# Patient Record
Sex: Female | Born: 1965 | Race: White | Hispanic: No | Marital: Single | Smoking: Current every day smoker
Health system: Southern US, Community
[De-identification: ages and names within clinical notes are randomized; demographics above are authoritative.]

## PROBLEM LIST (undated history)

## (undated) DIAGNOSIS — K219 Gastro-esophageal reflux disease without esophagitis: Secondary | ICD-10-CM

## (undated) DIAGNOSIS — G43909 Migraine, unspecified, not intractable, without status migrainosus: Secondary | ICD-10-CM

## (undated) DIAGNOSIS — T7840XA Allergy, unspecified, initial encounter: Secondary | ICD-10-CM

## (undated) DIAGNOSIS — J40 Bronchitis, not specified as acute or chronic: Secondary | ICD-10-CM

## (undated) HISTORY — PX: TUBAL LIGATION: SHX77

## (undated) HISTORY — PX: CHOLECYSTECTOMY: SHX55

## (undated) HISTORY — PX: ABLATION: SHX5711

## (undated) HISTORY — PX: BLADDER SURGERY: SHX569

---

## 2014-08-12 DIAGNOSIS — IMO0002 Reserved for concepts with insufficient information to code with codable children: Secondary | ICD-10-CM | POA: Insufficient documentation

## 2014-08-12 DIAGNOSIS — J309 Allergic rhinitis, unspecified: Secondary | ICD-10-CM | POA: Insufficient documentation

## 2014-08-12 DIAGNOSIS — R350 Frequency of micturition: Secondary | ICD-10-CM | POA: Insufficient documentation

## 2014-08-12 DIAGNOSIS — R1030 Lower abdominal pain, unspecified: Secondary | ICD-10-CM | POA: Insufficient documentation

## 2014-08-12 DIAGNOSIS — F309 Manic episode, unspecified: Secondary | ICD-10-CM | POA: Insufficient documentation

## 2014-08-12 DIAGNOSIS — F4310 Post-traumatic stress disorder, unspecified: Secondary | ICD-10-CM | POA: Insufficient documentation

## 2014-08-12 DIAGNOSIS — R519 Headache, unspecified: Secondary | ICD-10-CM | POA: Insufficient documentation

## 2014-08-12 DIAGNOSIS — N390 Urinary tract infection, site not specified: Secondary | ICD-10-CM | POA: Insufficient documentation

## 2014-08-12 DIAGNOSIS — R351 Nocturia: Secondary | ICD-10-CM | POA: Insufficient documentation

## 2014-08-12 DIAGNOSIS — B9689 Other specified bacterial agents as the cause of diseases classified elsewhere: Secondary | ICD-10-CM | POA: Insufficient documentation

## 2014-08-12 DIAGNOSIS — N3946 Mixed incontinence: Secondary | ICD-10-CM | POA: Insufficient documentation

## 2019-04-02 DIAGNOSIS — Z8619 Personal history of other infectious and parasitic diseases: Secondary | ICD-10-CM | POA: Insufficient documentation

## 2019-04-02 DIAGNOSIS — N200 Calculus of kidney: Secondary | ICD-10-CM | POA: Insufficient documentation

## 2019-11-15 DIAGNOSIS — G4719 Other hypersomnia: Secondary | ICD-10-CM | POA: Insufficient documentation

## 2019-11-15 DIAGNOSIS — F17210 Nicotine dependence, cigarettes, uncomplicated: Secondary | ICD-10-CM | POA: Insufficient documentation

## 2019-11-15 DIAGNOSIS — F411 Generalized anxiety disorder: Secondary | ICD-10-CM | POA: Insufficient documentation

## 2019-11-15 DIAGNOSIS — G8929 Other chronic pain: Secondary | ICD-10-CM | POA: Insufficient documentation

## 2019-12-04 ENCOUNTER — Emergency Department: Payer: Medicaid Other

## 2019-12-04 ENCOUNTER — Other Ambulatory Visit: Payer: Self-pay

## 2019-12-04 ENCOUNTER — Emergency Department
Admission: EM | Admit: 2019-12-04 | Discharge: 2019-12-04 | Disposition: A | Discharge status: Home / Self Care | Payer: Medicaid Other | Attending: Emergency Medicine | Admitting: Emergency Medicine

## 2019-12-04 DIAGNOSIS — R109 Unspecified abdominal pain: Secondary | ICD-10-CM | POA: Diagnosis present

## 2019-12-04 DIAGNOSIS — J209 Acute bronchitis, unspecified: Secondary | ICD-10-CM | POA: Diagnosis not present

## 2019-12-04 DIAGNOSIS — Z20822 Contact with and (suspected) exposure to covid-19: Secondary | ICD-10-CM | POA: Insufficient documentation

## 2019-12-04 DIAGNOSIS — J208 Acute bronchitis due to other specified organisms: Secondary | ICD-10-CM

## 2019-12-04 DIAGNOSIS — F17200 Nicotine dependence, unspecified, uncomplicated: Secondary | ICD-10-CM | POA: Diagnosis not present

## 2019-12-04 DIAGNOSIS — N200 Calculus of kidney: Secondary | ICD-10-CM | POA: Diagnosis not present

## 2019-12-04 HISTORY — DX: Bronchitis, not specified as acute or chronic: J40

## 2019-12-04 HISTORY — DX: Migraine, unspecified, not intractable, without status migrainosus: G43.909

## 2019-12-04 HISTORY — DX: Allergy, unspecified, initial encounter: T78.40XA

## 2019-12-04 LAB — SARS CORONAVIRUS 2 (TAT 6-24 HRS): SARS Coronavirus 2: NEGATIVE

## 2019-12-04 LAB — URINALYSIS, COMPLETE (UACMP) WITH MICROSCOPIC
Bacteria, UA: NONE SEEN
Bilirubin Urine: NEGATIVE
Glucose, UA: NEGATIVE mg/dL
Hgb urine dipstick: NEGATIVE
Ketones, ur: NEGATIVE mg/dL
Leukocytes,Ua: NEGATIVE
Nitrite: NEGATIVE
Protein, ur: NEGATIVE mg/dL
Specific Gravity, Urine: 1.009 (ref 1.005–1.030)
pH: 6 (ref 5.0–8.0)

## 2019-12-04 MED ORDER — TRAMADOL HCL 50 MG PO TABS: 50.0000 mg | ORAL_TABLET | Freq: Four times a day (QID) | ORAL | 0 refills | Status: DC | PRN

## 2019-12-04 MED ORDER — ALBUTEROL SULFATE HFA 108 (90 BASE) MCG/ACT IN AERS: 2.0000 | INHALATION_SPRAY | Freq: Four times a day (QID) | RESPIRATORY_TRACT | 0 refills | Status: AC | PRN

## 2019-12-04 MED ORDER — PREDNISONE 10 MG (21) PO TBPK: ORAL_TABLET | ORAL | 0 refills | Status: DC

## 2019-12-04 NOTE — ED Provider Notes (Signed)
[facility] Emergency Department Provider Note  ____________________________________________   First MD Initiated Contact with Patient 12/04/19 1759     (approximate)  I have reviewed the triage vital signs and the nursing notes.   HISTORY  Chief Complaint Back Pain    HPI Jacqueline Patel is a 54 y.o. female [name] emergency department complaining of cough and congestion with some chest tightness.  Symptoms for over a week getting worse over the last 4 days.  States the cough is productive.  Complaint #2 is she continues to have back pain and was told she has kidney stones.  She states she is hurting more than usual.  No fever or chills.  No chest pain/shortness of breath at this time.  No vomiting or diarrhea.  Unsure of Covid exposures    Past Medical History:  Diagnosis Date  . Allergies   . Bronchitis   . Migraines     There are no problems to display for this patient.   Past Surgical History:  Procedure Laterality Date  . ABLATION    . BLADDER SURGERY    . CESAREAN SECTION    . CHOLECYSTECTOMY    . TUBAL LIGATION      Prior to Admission medications   Medication Sig Start Date End Date Taking? Authorizing Provider  albuterol (VENTOLIN HFA) 108 (90 Base) MCG/ACT inhaler Inhale 2 puffs into the lungs every 6 (six) hours as needed for wheezing or shortness of breath. 12/04/19   Jacqueline Patel, Jacqueline Bering, PA-C  predniSONE (STERAPRED UNI-PAK 21 TAB) 10 MG (21) TBPK tablet Take 6 pills on day one then decrease by 1 pill each day 12/04/19   Jacqueline Ghee, PA-C  traMADol (ULTRAM) 50 MG tablet Take 1 tablet (50 mg total) by mouth every 6 (six) hours as needed. 12/04/19   Jacqueline Ghee, PA-C    Allergies Aleve  [naproxen], Codeine, Fexofenadine, Naproxen sodium, Tetracyclines & related, Dicyclomine, Sulfamethoxazole, Doxycycline, Fexofenadine-pseudoephed er, Sulfa antibiotics, and Sulfur  History reviewed. No pertinent family history.  Social  History Social History   Tobacco Use  . Smoking status: Current Every Day Smoker  . Smokeless tobacco: Never Used  Substance Use Topics  . Alcohol use: Yes    Comment: social  . Drug use: Yes    Types: Marijuana    Review of Systems  Constitutional: No fever/chills Eyes: No visual changes. ENT: No sore throat. Respiratory: Positive cough Cardiovascular: Denies chest pain Gastrointestinal: Denies abdominal pain, positive for pain at the kidney area Genitourinary: Negative for dysuria. Musculoskeletal: Negative for back pain. Skin: Negative for rash. Psychiatric: no mood changes,     ____________________________________________   PHYSICAL EXAM:  VITAL SIGNS: ED Triage Vitals [12/04/19 1656]  Enc Vitals Group     BP 128/71     Pulse Rate 86     Resp 18     Temp (!) 97.5 F (36.4 C)     Temp Source Oral     SpO2 98 %     Weight 165 lb (74.8 kg)     Height 5' 4.5" (1.638 m)     Head Circumference      Peak Flow      Pain Score 9     Pain Loc      Pain Edu?      Excl. in [place]?     Constitutional: Alert and oriented. Well appearing and in no acute distress. Eyes: Conjunctivae are normal.  Head: Atraumatic. Nose: No congestion/rhinnorhea. Mouth/Throat: Mucous  membranes are moist.   Neck:  supple no lymphadenopathy noted Cardiovascular: Normal rate, regular rhythm. Heart sounds are normal Respiratory: Normal respiratory effort.  No retractions, lungs c t a  Abd: soft nontender bs normal all 4 quad, no CVA tenderness GU: deferred Musculoskeletal: FROM all extremities, warm and well perfused Neurologic:  Normal speech and language.  Skin:  Skin is warm, dry and intact. No rash noted. Psychiatric: Mood and affect are normal. Speech and behavior are normal.  ____________________________________________   LABS (all labs ordered are listed, but only abnormal results are displayed)  Labs Reviewed  URINALYSIS, COMPLETE (UACMP) WITH MICROSCOPIC - Abnormal;  Notable for the following components:      Result Value   Color, Urine YELLOW (*)    APPearance CLEAR (*)    All other components within normal limits  SARS CORONAVIRUS 2 (TAT 6-24 HRS)   ____________________________________________   ____________________________________________  RADIOLOGY  Chest x-ray shows interstitial bronchitis of viral nature CT renal stone shows bilateral 2 mm kidney stones, no ureteral stones are noted   ____________________________________________   PROCEDURES  Procedure(s) performed: No  Procedures    ____________________________________________   INITIAL IMPRESSION / ASSESSMENT AND PLAN / ED COURSE  Pertinent labs & imaging results that were available during my care of the patient were reviewed by me and considered in my medical decision making (see chart for details).   Patient is 54 year old female presents emergency department for URI symptoms along with back pain from kidney stones.  See HPI  Physical exam shows patient to appear well.  Exam is unremarkable other than a dry cough.  DDX: covid, bronchitis, chronic back pain, kidney stone  UA is normal CT renal stone study is positive for 2 bilateral kidney stones, no ureteral stones are noted Chest x-ray shows a viral bronchitis   Explained all the findings to the patient.  She was given a prescription for Sterapred and albuterol inhaler.  Strict instructions to return if more difficulty breathing.  Explained to her Covid test would result in 6 to 24 hours.  If positive she should quarantine for an additional 10 days. CT results were explained to her.  Explained her these are stable stones ever within the kidneys.  She may or may not ever passed these.  She can drink lemon juice to help disintegrate stones.  Take over-the-counter pain medication.  She was also given a prescription for tramadol for pain not controlled by over-the-counter pain medication.  She is to follow-up with her regular  doctor.  Return emergency department as needed.  She discharged stable condition.  Jacqueline Patel was evaluated in Emergency Department on 12/04/2019 for the symptoms described in the history of present illness. She was evaluated in the context of the global COVID-19 pandemic, which necessitated consideration that the patient might be at risk for infection with the SARS-CoV-2 virus that causes COVID-19. Institutional protocols and algorithms that pertain to the evaluation of patients at risk for COVID-19 are in a state of rapid change based on information released by regulatory bodies including the CDC and federal and state organizations. These policies and algorithms were followed during the patient's care in the ED.   As part of my medical decision making, I reviewed the following data within the electronic MEDICAL RECORD NUMBER Nursing notes reviewed and incorporated, Labs reviewed , Old chart reviewed, Radiograph reviewed , Notes from prior ED visits and [place] Controlled Substance Database  ____________________________________________   FINAL CLINICAL IMPRESSION(S) / ED DIAGNOSES  Final  diagnoses:  Acute viral bronchitis  Suspected COVID-19 virus infection  Kidney stones      NEW MEDICATIONS STARTED DURING THIS VISIT:  Discharge Medication List as of 12/04/2019  7:33 PM    START taking these medications   Details  albuterol (VENTOLIN HFA) 108 (90 Base) MCG/ACT inhaler Inhale 2 puffs into the lungs every 6 (six) hours as needed for wheezing or shortness of breath., Starting Thu 12/04/2019, Normal    predniSONE (STERAPRED UNI-PAK 21 TAB) 10 MG (21) TBPK tablet Take 6 pills on day one then decrease by 1 pill each day, Normal    traMADol (ULTRAM) 50 MG tablet Take 1 tablet (50 mg total) by mouth every 6 (six) hours as needed., Starting Thu 12/04/2019, Normal         Note:  This document was prepared using Dragon voice recognition software and may include unintentional dictation errors.     Versie Starks, PA-C 12/04/19 1948    Vanessa Brilliant, MD 12/08/19 [phone]

## 2019-12-04 NOTE — ED Notes (Addendum)
CT RENAL STONE STUDY not complete at this time

## 2019-12-04 NOTE — ED Triage Notes (Signed)
Pt to the er for back pain at the left kidney. Pt has been dx with stones but has imaging scheduled soon with pcp. Pt states she has also lost her voice and is cold and has SOB, and coughs and blows her nose which are both productive.

## 2019-12-04 NOTE — Discharge Instructions (Addendum)
Follow-up with your regular doctor for kidney stones.  Return emergency department if worsening and having more difficulty breathing.  Your Covid test should return in 6 to 24 hours.  If you have MyChart you will be able to see the result immediately.  Otherwise someone will call you if it is positive.  Take your medications as prescribed.  The tramadol is for pain not controlled by over-the-counter pain medications.

## 2019-12-04 NOTE — ED Notes (Signed)
Pt reports lower back pain.  No known injury  No n/v/d.   Hx kidney stones.  Pt also has wraspy voice, cough for several weeks.  No fever  cig smoker.  Pt alert

## 2019-12-13 ENCOUNTER — Emergency Department
Admission: EM | Admit: 2019-12-13 | Discharge: 2019-12-14 | Disposition: A | Discharge status: Home / Self Care | Payer: Medicaid Other | Attending: Emergency Medicine | Admitting: Emergency Medicine

## 2019-12-13 ENCOUNTER — Encounter: Payer: Self-pay | Admitting: Emergency Medicine

## 2019-12-13 ENCOUNTER — Emergency Department: Payer: Medicaid Other

## 2019-12-13 ENCOUNTER — Other Ambulatory Visit: Payer: Self-pay

## 2019-12-13 DIAGNOSIS — Z20822 Contact with and (suspected) exposure to covid-19: Secondary | ICD-10-CM | POA: Insufficient documentation

## 2019-12-13 DIAGNOSIS — J40 Bronchitis, not specified as acute or chronic: Secondary | ICD-10-CM

## 2019-12-13 DIAGNOSIS — R0789 Other chest pain: Secondary | ICD-10-CM | POA: Diagnosis present

## 2019-12-13 DIAGNOSIS — F17200 Nicotine dependence, unspecified, uncomplicated: Secondary | ICD-10-CM | POA: Diagnosis not present

## 2019-12-13 LAB — BASIC METABOLIC PANEL
Anion gap: 8 (ref 5–15)
BUN: 10 mg/dL (ref 6–20)
CO2: 21 mmol/L — ABNORMAL LOW (ref 22–32)
Calcium: 8.6 mg/dL — ABNORMAL LOW (ref 8.9–10.3)
Chloride: 107 mmol/L (ref 98–111)
Creatinine, Ser: 0.59 mg/dL (ref 0.44–1.00)
GFR calc Af Amer: >60 mL/min (ref >60)
GFR calc non Af Amer: >60 mL/min (ref >60)
Glucose, Bld: 122 mg/dL — ABNORMAL HIGH (ref 70–99)
Potassium: 3.3 mmol/L — ABNORMAL LOW (ref 3.5–5.1)
Sodium: 136 mmol/L (ref 135–145)

## 2019-12-13 LAB — CBC
HCT: 47.7 % — ABNORMAL HIGH (ref 36.0–46.0)
Hemoglobin: 15.6 g/dL — ABNORMAL HIGH (ref 12.0–15.0)
MCH: 30.2 pg (ref 26.0–34.0)
MCHC: 32.7 g/dL (ref 30.0–36.0)
MCV: 92.4 fL (ref 80.0–100.0)
Platelets: 287 10*3/uL (ref 150–400)
RBC: 5.16 MIL/uL — ABNORMAL HIGH (ref 3.87–5.11)
RDW: 13.6 % (ref 11.5–15.5)
WBC: 10 10*3/uL (ref 4.0–10.5)
nRBC: 0 % (ref 0.0–0.2)

## 2019-12-13 LAB — TROPONIN I (HIGH SENSITIVITY): Troponin I (High Sensitivity): 4 ng/L (ref <18)

## 2019-12-13 MED ORDER — SODIUM CHLORIDE 0.9 % IV BOLUS
1000.0000 mL | Freq: Once | INTRAVENOUS | Status: AC
  Administered 2019-12-14: 1000 mL via INTRAVENOUS

## 2019-12-13 MED ORDER — IPRATROPIUM-ALBUTEROL 0.5-2.5 (3) MG/3ML IN SOLN
3.0000 mL | Freq: Once | RESPIRATORY_TRACT | Status: AC
  Administered 2019-12-14: 3 mL via RESPIRATORY_TRACT
  Filled 2019-12-13: qty 3

## 2019-12-13 MED ORDER — METHYLPREDNISOLONE SODIUM SUCC 125 MG IJ SOLR
125.0000 mg | Freq: Once | INTRAMUSCULAR | Status: AC
  Administered 2019-12-14: 125 mg via INTRAVENOUS
  Filled 2019-12-13: qty 2

## 2019-12-13 MED ORDER — ONDANSETRON HCL 4 MG/2ML IJ SOLN
4.0000 mg | Freq: Once | INTRAMUSCULAR | Status: AC
  Administered 2019-12-14: 4 mg via INTRAVENOUS
  Filled 2019-12-13: qty 2

## 2019-12-13 NOTE — ED Triage Notes (Signed)
Pt arrives POV tro triage with c/o chest pain today. Pt states that she was sneezing an abnormal amount yesterday and is unsure if the pain is due to that fact. Pt is sleepy at this time in triage due to taking x 2 tylenol PM for the pain.

## 2019-12-14 LAB — TROPONIN I (HIGH SENSITIVITY): Troponin I (High Sensitivity): 5 ng/L (ref <18)

## 2019-12-14 LAB — SARS CORONAVIRUS 2 (TAT 6-24 HRS): SARS Coronavirus 2: NEGATIVE

## 2019-12-14 MED ORDER — PREDNISONE 20 MG PO TABS: 60.0000 mg | ORAL_TABLET | Freq: Every day | ORAL | 0 refills | Status: AC

## 2019-12-14 MED ORDER — AZITHROMYCIN 250 MG PO TABS: ORAL_TABLET | ORAL | 0 refills | Status: DC

## 2019-12-14 NOTE — ED Provider Notes (Signed)
[facility] Emergency Department Provider Note  ____________________________________________  Time seen: Approximately 12:23 AM  I have reviewed the triage vital signs and the nursing notes.   HISTORY  Chief Complaint Chest Pain   HPI Jacqueline Patel is a 54 y.o. female with a history of bronchitis, allergies, asthma, smoking who presents for evaluation of chest tightness.   Patient was seen here 10 days ago for similar and diagnosed with bronchitis.  She was sent home on steroids.  She reports that she felt better for a few days however 3 days ago started feeling worse again.  She tested negative for Covid during that visit.  Her symptoms have been cough productive of gray sputum, intermittent wheezing, chest tightness, body aches, and a mild sore throat.  She also has had nausea and feels very fatigued.  She has been having intermittent tightness in her chest.  No pleuritic chest pain, no shortness of breath, no vomiting or diarrhea.  No personal history of blood clots, no recent travel immobilization, no leg pain or swelling, no hemoptysis or exogenous hormones.  Patient continues to smoke.  Past Medical History:  Diagnosis Date  . Allergies   . Bronchitis   . Migraines     There are no problems to display for this patient.   Past Surgical History:  Procedure Laterality Date  . ABLATION    . BLADDER SURGERY    . CESAREAN SECTION    . CHOLECYSTECTOMY    . TUBAL LIGATION      Prior to Admission medications   Medication Sig Start Date End Date Taking? Authorizing Provider  albuterol (VENTOLIN HFA) 108 (90 Base) MCG/ACT inhaler Inhale 2 puffs into the lungs every 6 (six) hours as needed for wheezing or shortness of breath. 12/04/19   Fisher, Roselyn Bering, PA-C  azithromycin (ZITHROMAX) 250 MG tablet Take 2 pills day one and then 1 pill a day for the next 4 days 12/14/19   Don Perking, [place], MD  predniSONE (DELTASONE) 20 MG tablet Take 3 tablets (60 mg total)  by mouth daily for 4 days. 12/14/19 12/18/19  Nita Sickle, MD  traMADol (ULTRAM) 50 MG tablet Take 1 tablet (50 mg total) by mouth every 6 (six) hours as needed. 12/04/19   Faythe Ghee, PA-C    Allergies Aleve  [naproxen], Codeine, Fexofenadine, Naproxen sodium, Tetracyclines & related, Dicyclomine, Sulfamethoxazole, Doxycycline, Fexofenadine-pseudoephed er, Sulfa antibiotics, and Sulfur  No family history on file.  Social History Social History   Tobacco Use  . Smoking status: Current Every Day Smoker  . Smokeless tobacco: Never Used  Substance Use Topics  . Alcohol use: Yes    Comment: social  . Drug use: Yes    Types: Marijuana    Review of Systems  Constitutional: Negative for fever. + fatigue Eyes: Negative for visual changes. ENT: Negative for sore throat. Neck: No neck pain  Cardiovascular: Negative for chest pain. + chest tightness Respiratory: Negative for shortness of breath. + cough Gastrointestinal: Negative for abdominal pain, vomiting or diarrhea. + nausea Genitourinary: Negative for dysuria. Musculoskeletal: Negative for back pain. Skin: Negative for rash. Neurological: Negative for headaches, weakness or numbness. Psych: No SI or HI  ____________________________________________   PHYSICAL EXAM:  VITAL SIGNS: ED Triage Vitals  Enc Vitals Group     BP 12/13/19 2257 111/71     Pulse Rate 12/13/19 2257 82     Resp 12/13/19 2257 18     Temp 12/13/19 2257 97.8 F (36.6 C)  Temp Source 12/13/19 2257 Oral     SpO2 12/13/19 2257 95 %     Weight 12/13/19 2254 165 lb (74.8 kg)     Height 12/13/19 2254 5\' 4"  (1.626 m)     Head Circumference --      Peak Flow --      Pain Score 12/13/19 2254 10     Pain Loc --      Pain Edu? --      Excl. in [place]? --     Constitutional: Alert and oriented. Well appearing and in no apparent distress. HEENT:      Head: Normocephalic and atraumatic.         Eyes: Conjunctivae are normal. Sclera is non-icteric.         Mouth/Throat: Mucous membranes are moist.       Neck: Supple with no signs of meningismus. Cardiovascular: Regular rate and rhythm. No murmurs, gallops, or rubs. 2+ symmetrical distal pulses are present in all extremities. Respiratory: Normal respiratory effort. Lungs are clear to auscultation bilaterally. No wheezes, crackles, or rhonchi.  Gastrointestinal: Soft, non tender, and non distended  Musculoskeletal: Nontender with normal range of motion in all extremities. No edema, cyanosis, or erythema of extremities. Neurologic: Normal speech and language. Face is symmetric. Moving all extremities. No gross focal neurologic deficits are appreciated. Skin: Skin is warm, dry and intact. No rash noted. Psychiatric: Mood and affect are normal. Speech and behavior are normal.  ____________________________________________   LABS (all labs ordered are listed, but only abnormal results are displayed)  Labs Reviewed  BASIC METABOLIC PANEL - Abnormal; Notable for the following components:      Result Value   Potassium 3.3 (*)    CO2 21 (*)    Glucose, Bld 122 (*)    Calcium 8.6 (*)    All other components within normal limits  CBC - Abnormal; Notable for the following components:   RBC 5.16 (*)    Hemoglobin 15.6 (*)    HCT 47.7 (*)    All other components within normal limits  SARS CORONAVIRUS 2 (TAT 6-24 HRS)  TROPONIN I (HIGH SENSITIVITY)  TROPONIN I (HIGH SENSITIVITY)   ____________________________________________  EKG  ED ECG REPORT I, Rudene Re, the attending physician, personally viewed and interpreted this ECG.  Normal sinus rhythm, rate of 77, normal intervals, normal axis, no ST elevations or depressions, anterior Q waves.  No prior for comparison. ____________________________________________  RADIOLOGY  I have personally reviewed the images performed during this visit and I agree with the Radiologist's read.   Interpretation by Radiologist:  DG Chest 2  View  Result Date: 12/13/2019 CLINICAL DATA:  Chest pain. EXAM: CHEST - 2 VIEW COMPARISON:  Radiograph 12/03/2018 FINDINGS: Low lung volumes with persistent but improved streaky bibasilar opacities, likely atelectasis or scarring. No confluent airspace disease. Normal heart size and mediastinal contours. No pulmonary edema, pleural effusion, or pneumothorax. No acute osseous abnormalities are seen. IMPRESSION: Low lung volumes with bibasilar atelectasis or scarring. Electronically Signed   By: Keith Rake M.D.   On: 12/13/2019 23:14     ____________________________________________   PROCEDURES  Procedure(s) performed: None Procedures Critical Care performed:  None ____________________________________________   INITIAL IMPRESSION / ASSESSMENT AND PLAN / ED COURSE  54 y.o. female with a history of bronchitis, allergies, asthma, smoking who presents for evaluation of chest tightness, productive cough, fatigue, nausea, body aches, sore throat.  Patient is well-appearing in no distress with normal vitals, normal work of breathing, normal  sats, no wheezing or crackles with good air movement, legs have no swelling or erythema.  Differential diagnosis includes bronchitis versus Covid versus viral URI versus pneumonia.  We will recheck Covid, get a chest x-ray, labs, EKG.  We will give duo nebs and Solu-Medrol, will give IV fluids and Zofran for symptom relief.   _________________________ 4:11 AM on 12/14/2019 -----------------------------------------    As part of my medical decision making, I reviewed the following data within the electronic MEDICAL RECORD NUMBER CXR with no evidence of PNA or edema. I have  reviewed the EKG and looked at the rhythm strip in the room which showed NSR. I have reviewed patient's previous medical records and PMH. Labs were reviewed by me and showed no leukocytosis, troponin x 3 negative.  Patient feels markedly improved after therapies in the ED. Presentation  concerning for most likely bronchitis.  Will discharge home on prednisone and a Z-Pak.  Discussed my standard return precautions and follow-up with PCP.  Covid swab is pending.  Discussed quarantine until results are back and home care if they are positive.      Please note:  Patient was evaluated in Emergency Department today for the symptoms described in the history of present illness. Patient was evaluated in the context of the global COVID-19 pandemic, which necessitated consideration that the patient might be at risk for infection with the SARS-CoV-2 virus that causes COVID-19. Institutional protocols and algorithms that pertain to the evaluation of patients at risk for COVID-19 are in a state of rapid change based on information released by regulatory bodies including the CDC and federal and state organizations. These policies and algorithms were followed during the patient's care in the ED.  Some ED evaluations and interventions may be delayed as a result of limited staffing during the pandemic.   ____________________________________________   FINAL CLINICAL IMPRESSION(S) / ED DIAGNOSES   Final diagnoses:  Bronchitis      NEW MEDICATIONS STARTED DURING THIS VISIT:  ED Discharge Orders         Ordered    predniSONE (DELTASONE) 20 MG tablet  Daily     12/14/19 0411    azithromycin (ZITHROMAX) 250 MG tablet     12/14/19 0411           Note:  This document was prepared using Dragon voice recognition software and may include unintentional dictation errors.    Don Perking, [place], MD 12/14/19 [phone]

## 2019-12-14 NOTE — ED Notes (Addendum)
Pt st it's easier to breath post breathing tx. Pt currently lying in bed asleep with no concerns or acute distress noted

## 2020-01-24 ENCOUNTER — Other Ambulatory Visit: Payer: Self-pay

## 2020-01-24 DIAGNOSIS — R0602 Shortness of breath: Secondary | ICD-10-CM | POA: Diagnosis present

## 2020-01-24 DIAGNOSIS — R2243 Localized swelling, mass and lump, lower limb, bilateral: Secondary | ICD-10-CM | POA: Insufficient documentation

## 2020-01-24 DIAGNOSIS — Z5321 Procedure and treatment not carried out due to patient leaving prior to being seen by health care provider: Secondary | ICD-10-CM | POA: Diagnosis not present

## 2020-01-24 NOTE — ED Triage Notes (Signed)
Patient reports symptoms off/on for several days.  Patient reports shortness of breath, felt like she was wheezing and that legs and feet have been swelling.

## 2020-01-25 ENCOUNTER — Emergency Department: Payer: Medicaid Other

## 2020-01-25 ENCOUNTER — Emergency Department
Admission: EM | Admit: 2020-01-25 | Discharge: 2020-01-25 | Disposition: A | Discharge status: Home / Self Care | Payer: Medicaid Other | Attending: Emergency Medicine | Admitting: Emergency Medicine

## 2020-01-25 LAB — CBC
HCT: 40.8 % (ref 36.0–46.0)
Hemoglobin: 13.3 g/dL (ref 12.0–15.0)
MCH: 30.4 pg (ref 26.0–34.0)
MCHC: 32.6 g/dL (ref 30.0–36.0)
MCV: 93.4 fL (ref 80.0–100.0)
Platelets: 264 10*3/uL (ref 150–400)
RBC: 4.37 MIL/uL (ref 3.87–5.11)
RDW: 13.7 % (ref 11.5–15.5)
WBC: 7.3 10*3/uL (ref 4.0–10.5)
nRBC: 0 % (ref 0.0–0.2)

## 2020-01-25 LAB — BASIC METABOLIC PANEL
Anion gap: 7 (ref 5–15)
BUN: 13 mg/dL (ref 6–20)
CO2: 28 mmol/L (ref 22–32)
Calcium: 9.3 mg/dL (ref 8.9–10.3)
Chloride: 105 mmol/L (ref 98–111)
Creatinine, Ser: 0.78 mg/dL (ref 0.44–1.00)
GFR calc Af Amer: >60 mL/min (ref >60)
GFR calc non Af Amer: >60 mL/min (ref >60)
Glucose, Bld: 116 mg/dL — ABNORMAL HIGH (ref 70–99)
Potassium: 3.5 mmol/L (ref 3.5–5.1)
Sodium: 140 mmol/L (ref 135–145)

## 2020-01-25 LAB — TROPONIN I (HIGH SENSITIVITY): Troponin I (High Sensitivity): 4 ng/L (ref <18)

## 2020-01-26 ENCOUNTER — Telehealth: Payer: Self-pay | Admitting: Emergency Medicine

## 2020-01-26 NOTE — Telephone Encounter (Signed)
Called patient due to lwot to inquire about condition and follow up plans. Says she is not better.  Was talking about coming back.  She does not have pcp here, and is just visiting to care for a releative.  I suggested she could try urgent care as an alternative and that the [facility] and muc can see the lab results from her visit here.

## 2020-01-31 ENCOUNTER — Other Ambulatory Visit: Payer: Self-pay

## 2020-01-31 ENCOUNTER — Emergency Department: Payer: Medicaid Other

## 2020-01-31 ENCOUNTER — Emergency Department
Admission: EM | Admit: 2020-01-31 | Discharge: 2020-01-31 | Disposition: A | Discharge status: Home / Self Care | Payer: Medicaid Other | Attending: Emergency Medicine | Admitting: Emergency Medicine

## 2020-01-31 ENCOUNTER — Encounter: Payer: Self-pay | Admitting: Emergency Medicine

## 2020-01-31 DIAGNOSIS — R0789 Other chest pain: Secondary | ICD-10-CM | POA: Diagnosis present

## 2020-01-31 DIAGNOSIS — F17210 Nicotine dependence, cigarettes, uncomplicated: Secondary | ICD-10-CM | POA: Diagnosis not present

## 2020-01-31 DIAGNOSIS — Z79899 Other long term (current) drug therapy: Secondary | ICD-10-CM | POA: Diagnosis not present

## 2020-01-31 DIAGNOSIS — J42 Unspecified chronic bronchitis: Secondary | ICD-10-CM

## 2020-01-31 DIAGNOSIS — J441 Chronic obstructive pulmonary disease with (acute) exacerbation: Secondary | ICD-10-CM | POA: Diagnosis not present

## 2020-01-31 DIAGNOSIS — I872 Venous insufficiency (chronic) (peripheral): Secondary | ICD-10-CM | POA: Diagnosis not present

## 2020-01-31 LAB — CBC
HCT: 39.9 % (ref 36.0–46.0)
Hemoglobin: 13.1 g/dL (ref 12.0–15.0)
MCH: 30.6 pg (ref 26.0–34.0)
MCHC: 32.8 g/dL (ref 30.0–36.0)
MCV: 93.2 fL (ref 80.0–100.0)
Platelets: 269 10*3/uL (ref 150–400)
RBC: 4.28 MIL/uL (ref 3.87–5.11)
RDW: 13.8 % (ref 11.5–15.5)
WBC: 5.8 10*3/uL (ref 4.0–10.5)
nRBC: 0 % (ref 0.0–0.2)

## 2020-01-31 LAB — BASIC METABOLIC PANEL
Anion gap: 5 (ref 5–15)
BUN: 11 mg/dL (ref 6–20)
CO2: 29 mmol/L (ref 22–32)
Calcium: 9.2 mg/dL (ref 8.9–10.3)
Chloride: 108 mmol/L (ref 98–111)
Creatinine, Ser: 0.59 mg/dL (ref 0.44–1.00)
GFR calc Af Amer: >60 mL/min (ref >60)
GFR calc non Af Amer: >60 mL/min (ref >60)
Glucose, Bld: 99 mg/dL (ref 70–99)
Potassium: 3.8 mmol/L (ref 3.5–5.1)
Sodium: 142 mmol/L (ref 135–145)

## 2020-01-31 LAB — TROPONIN I (HIGH SENSITIVITY)
Troponin I (High Sensitivity): 3 ng/L (ref <18)
Troponin I (High Sensitivity): 3 ng/L (ref <18)

## 2020-01-31 MED ORDER — IPRATROPIUM-ALBUTEROL 0.5-2.5 (3) MG/3ML IN SOLN
3.0000 mL | Freq: Once | RESPIRATORY_TRACT | Status: AC
  Administered 2020-01-31: 14:00:00 3 mL via RESPIRATORY_TRACT
  Filled 2020-01-31: qty 3

## 2020-01-31 MED ORDER — SODIUM CHLORIDE 0.9% FLUSH: 3.0000 mL | Freq: Once | INTRAVENOUS | Status: DC

## 2020-01-31 NOTE — ED Provider Notes (Addendum)
[facility] Emergency Department Provider Note   ____________________________________________   First MD Initiated Contact with Patient 01/31/20 1315     (approximate)  I have reviewed the triage vital signs and the nursing notes.   HISTORY  Chief Complaint Chest Pain and Multiple Medical Complaints    HPI Jacqueline Patel is a 54 y.o. female with possible history of seasonal allergies, bronchitis, and migraines who presents to the ED complaining of leg swelling.  Patient reports that she has been dealing with increasing swelling in both of her legs, right greater than left, over at least the past week.  She has also had increasing pain in the area of her right calf but denies any trauma to this area.  She denies any history of DVT/PE, but does state that she has been dealing with intermittent chest pain and difficulty breathing.  She describes this pain as ongoing for greater than 1 month, which feels like either pins-and-needles or a pressure.  Pain can come on at any time, is not exacerbated or alleviated by anything.  She deals with a frequent dry cough as well as occasional difficulty catching her breath, does admit to smoking almost 1 pack/day cigarettes.  She was previously diagnosed with bronchitis and completed a course of azithromycin as well as steroids, has had slight improvement in her symptoms since then.        Past Medical History:  Diagnosis Date  . Allergies   . Bronchitis   . Migraines     There are no problems to display for this patient.   Past Surgical History:  Procedure Laterality Date  . ABLATION    . BLADDER SURGERY    . CESAREAN SECTION    . CHOLECYSTECTOMY    . TUBAL LIGATION      Prior to Admission medications   Medication Sig Start Date End Date Taking? Authorizing Provider  albuterol (VENTOLIN HFA) 108 (90 Base) MCG/ACT inhaler Inhale 2 puffs into the lungs every 6 (six) hours as needed for wheezing or shortness of  breath. 12/04/19   Fisher, Linden Dolin, PA-C  azithromycin (ZITHROMAX) 250 MG tablet Take 2 pills day one and then 1 pill a day for the next 4 days 12/14/19   Alfred Levins, [place], MD  traMADol (ULTRAM) 50 MG tablet Take 1 tablet (50 mg total) by mouth every 6 (six) hours as needed. 12/04/19   Versie Starks, PA-C    Allergies Aleve  [naproxen], Codeine, Fexofenadine, Naproxen sodium, Tetracyclines & related, Dicyclomine, Sulfamethoxazole, Doxycycline, Fexofenadine-pseudoephed er, Sulfa antibiotics, and Sulfur  No family history on file.  Social History Social History   Tobacco Use  . Smoking status: Current Every Day Smoker  . Smokeless tobacco: Never Used  Substance Use Topics  . Alcohol use: Yes    Comment: social  . Drug use: Yes    Types: Marijuana    Review of Systems  Constitutional: No fever/chills Eyes: No visual changes. ENT: No sore throat. Cardiovascular: Positive for chest pain. Respiratory: Positive for cough and shortness of breath. Gastrointestinal: No abdominal pain.  No nausea, no vomiting.  No diarrhea.  No constipation. Genitourinary: Negative for dysuria. Musculoskeletal: Negative for back pain.  Positive for leg swelling and pain. Skin: Negative for rash. Neurological: Negative for headaches, focal weakness or numbness.  ____________________________________________   PHYSICAL EXAM:  VITAL SIGNS: ED Triage Vitals [01/31/20 1308]  Enc Vitals Group     BP (!) 113/57     Pulse Rate 66  Resp 16     Temp 97.9 F (36.6 C)     Temp Source Oral     SpO2 99 %     Weight      Height      Head Circumference      Peak Flow      Pain Score 9     Pain Loc      Pain Edu?      Excl. in [place]?     Constitutional: Alert and oriented. Eyes: Conjunctivae are normal. Head: Atraumatic. Nose: No congestion/rhinnorhea. Mouth/Throat: Mucous membranes are moist. Neck: Normal ROM Cardiovascular: Normal rate, regular rhythm. Grossly normal heart sounds.  Respiratory: Normal respiratory effort.  No retractions.  Mild end expiratory wheezing. Gastrointestinal: Soft and nontender. No distention. Genitourinary: deferred Musculoskeletal: 2+ pitting edema to right lower extremity, 1+ pitting edema to left lower extremity.  No calf tenderness noted bilaterally. Neurologic:  Normal speech and language. No gross focal neurologic deficits are appreciated. Skin:  Skin is warm, dry and intact. No rash noted. Psychiatric: Mood and affect are normal. Speech and behavior are normal.  ____________________________________________   LABS (all labs ordered are listed, but only abnormal results are displayed)  Labs Reviewed  CBC  BASIC METABOLIC PANEL  TROPONIN I (HIGH SENSITIVITY)  TROPONIN I (HIGH SENSITIVITY)   ____________________________________________  EKG  ED ECG REPORT I, Chesley Noon, the attending physician, personally viewed and interpreted this ECG.   Date: 01/31/2020  EKG Time: 13:07  Rate: 70  Rhythm: normal sinus rhythm  Axis: Normal  Intervals:none  ST&T Change: None   PROCEDURES  Procedure(s) performed (including Critical Care):  Procedures   ____________________________________________   INITIAL IMPRESSION / ASSESSMENT AND PLAN / ED COURSE       54 year old female with history of cigarette smoking and seasonal allergies presents to the ED primarily for lower extremity swelling and pain, right greater than left.  She does have edema to her bilateral lower extremities and we will further assess for DVT with ultrasound.  CHF seems less likely, but will check chest x-ray and BNP.  She is not in any respiratory distress and chest pain very atypical for ACS, EKG without evidence of arrhythmia or ischemia.  Her symptoms also seem atypical for PE as she denies any chest pain or shortness of breath currently.  We will need to further assess for PE if she were to have a DVT, but will hold off on CTA for now.  Plan to treat  her mild wheezing with DuoNeb.  Ultrasound is negative for DVT and given patient does not have any chest pain or shortness of breath, I doubt PE at this time.  Chest x-ray is negative for acute process, initial troponin is negative.  Low suspicion for ACS, we will screen additional troponin and if this is negative she will be appropriate for discharge home.  She was counseled to use compression stockings for likely venous insufficiency and to schedule follow-up with a PCP regarding apparent chronic bronchitis.  No antibiotics indicated at this time.  Patient was counseled to return to the ED for new or worsening symptoms.  Repeat troponin within normal limits, patient appropriate for discharge home.      ____________________________________________   FINAL CLINICAL IMPRESSION(S) / ED DIAGNOSES  Final diagnoses:  Chronic bronchitis, unspecified chronic bronchitis type (HCC)  Venous insufficiency of both lower extremities     ED Discharge Orders    None       Note:  This document  was prepared using Conservation officer, historic buildings and may include unintentional dictation errors.   Chesley Noon, MD 01/31/20 1525    Chesley Noon, MD 01/31/20 [id number]

## 2020-01-31 NOTE — ED Notes (Signed)
Pt transported to xray 

## 2020-01-31 NOTE — ED Triage Notes (Signed)
Pt to ED via POV c/o multiple medical complaints. Pt states that she does not have a PCP in this area because she is only living here temporarily. Pt states that she is having chest pain, shortness of breath with intermittent wheezing, swelling in her ankles and feet. Pt states that this morning she had muscle spasms in both her upper legs and that her right hand feels like it is swollen because she is not able to close it to make a fist. Pt seen for similar symptoms on 4/18. Pt is in NAD.

## 2020-01-31 NOTE — ED Notes (Signed)
ED Provider at bedside. 

## 2020-03-17 ENCOUNTER — Other Ambulatory Visit: Payer: Self-pay | Admitting: Family Medicine

## 2020-03-17 DIAGNOSIS — Z1231 Encounter for screening mammogram for malignant neoplasm of breast: Secondary | ICD-10-CM

## 2020-03-21 ENCOUNTER — Encounter: Payer: Self-pay | Admitting: Intensive Care

## 2020-03-21 ENCOUNTER — Emergency Department: Payer: Medicaid Other

## 2020-03-21 ENCOUNTER — Other Ambulatory Visit: Payer: Self-pay

## 2020-03-21 ENCOUNTER — Emergency Department
Admission: EM | Admit: 2020-03-21 | Discharge: 2020-03-22 | Disposition: A | Discharge status: Home / Self Care | Payer: Medicaid Other | Attending: Emergency Medicine | Admitting: Emergency Medicine

## 2020-03-21 DIAGNOSIS — R2243 Localized swelling, mass and lump, lower limb, bilateral: Secondary | ICD-10-CM | POA: Insufficient documentation

## 2020-03-21 DIAGNOSIS — R079 Chest pain, unspecified: Secondary | ICD-10-CM

## 2020-03-21 DIAGNOSIS — R0602 Shortness of breath: Secondary | ICD-10-CM | POA: Diagnosis not present

## 2020-03-21 DIAGNOSIS — Z20822 Contact with and (suspected) exposure to covid-19: Secondary | ICD-10-CM | POA: Diagnosis not present

## 2020-03-21 DIAGNOSIS — J40 Bronchitis, not specified as acute or chronic: Secondary | ICD-10-CM | POA: Diagnosis not present

## 2020-03-21 DIAGNOSIS — F17210 Nicotine dependence, cigarettes, uncomplicated: Secondary | ICD-10-CM | POA: Diagnosis not present

## 2020-03-21 HISTORY — DX: Gastro-esophageal reflux disease without esophagitis: K21.9

## 2020-03-21 LAB — CBC
HCT: 42.7 % (ref 36.0–46.0)
Hemoglobin: 14 g/dL (ref 12.0–15.0)
MCH: 30 pg (ref 26.0–34.0)
MCHC: 32.8 g/dL (ref 30.0–36.0)
MCV: 91.6 fL (ref 80.0–100.0)
Platelets: 236 10*3/uL (ref 150–400)
RBC: 4.66 MIL/uL (ref 3.87–5.11)
RDW: 13.4 % (ref 11.5–15.5)
WBC: 6.3 10*3/uL (ref 4.0–10.5)
nRBC: 0 % (ref 0.0–0.2)

## 2020-03-21 LAB — BASIC METABOLIC PANEL
Anion gap: 7 (ref 5–15)
BUN: 10 mg/dL (ref 6–20)
CO2: 24 mmol/L (ref 22–32)
Calcium: 8.4 mg/dL — ABNORMAL LOW (ref 8.9–10.3)
Chloride: 109 mmol/L (ref 98–111)
Creatinine, Ser: 0.75 mg/dL (ref 0.44–1.00)
GFR calc Af Amer: >60 mL/min (ref >60)
GFR calc non Af Amer: >60 mL/min (ref >60)
Glucose, Bld: 160 mg/dL — ABNORMAL HIGH (ref 70–99)
Potassium: 3.5 mmol/L (ref 3.5–5.1)
Sodium: 140 mmol/L (ref 135–145)

## 2020-03-21 LAB — TROPONIN I (HIGH SENSITIVITY)
Troponin I (High Sensitivity): 4 ng/L (ref <18)
Troponin I (High Sensitivity): 3 ng/L (ref <18)

## 2020-03-21 LAB — HEPATIC FUNCTION PANEL
ALT: 30 U/L (ref 0–44)
AST: 23 U/L (ref 15–41)
Albumin: 3 g/dL — ABNORMAL LOW (ref 3.5–5.0)
Alkaline Phosphatase: 41 U/L (ref 38–126)
Bilirubin, Direct: <0.1 mg/dL (ref 0.0–0.2)
Total Bilirubin: 0.6 mg/dL (ref 0.3–1.2)
Total Protein: 5.1 g/dL — ABNORMAL LOW (ref 6.5–8.1)

## 2020-03-21 LAB — FIBRIN DERIVATIVES D-DIMER (ARMC ONLY): Fibrin derivatives D-dimer (ARMC): 460.11 ng/mL (FEU) (ref 0.00–499.00)

## 2020-03-21 LAB — BRAIN NATRIURETIC PEPTIDE: B Natriuretic Peptide: 26.1 pg/mL (ref 0.0–100.0)

## 2020-03-21 MED ORDER — ACETAMINOPHEN 500 MG PO TABS
1000.0000 mg | ORAL_TABLET | Freq: Once | ORAL | Status: AC
  Administered 2020-03-21: 1000 mg via ORAL
  Filled 2020-03-21: qty 2

## 2020-03-21 MED ORDER — LIDOCAINE 5 % EX PTCH
1.0000 | MEDICATED_PATCH | CUTANEOUS | Status: DC
  Administered 2020-03-21: 1 via TRANSDERMAL
  Filled 2020-03-21: qty 1

## 2020-03-21 MED ORDER — PREDNISONE 20 MG PO TABS: 40.0000 mg | ORAL_TABLET | Freq: Every day | ORAL | 0 refills | Status: AC

## 2020-03-21 NOTE — Discharge Instructions (Signed)
No signs of a heart attack.  you can follow-up with cardiology to be further evaluated if you continue to have chest pain.  We could treat you some steroids in case this could be related to your smoking history and some mild bronchitis.  Return to the ER if you have worsening symptoms, worsening shortness of breath or any other concerns

## 2020-03-21 NOTE — ED Provider Notes (Signed)
[facility] Emergency Department Provider Note  ____________________________________________   First MD Initiated Contact with Patient 03/21/20 2100     (approximate)  I have reviewed the triage vital signs and the nursing notes.   HISTORY  Chief Complaint Chest Pain    HPI Jacqueline Patel is a 54 y.o. female with acid reflux, bronchitis, migraines who comes in with chest pain.  Patient reports having chest pain that started last night.  Patient states that she is had this chest pain before that happens intermittently but it seems to be worse since last night.  The pain is intermittent, sharp stabbing, worse with certain movements.  Worse at nighttime.  Denies it being like a burning sensation.  Denies the pain radiating to your back.  She states that she occasionally thinks the pain is a little bit worse with a deep breath.  Denies any fevers, cough.  She denies any other risk factors for DVT although she does have a little bit of asymmetric leg swelling but had that evaluated back in April and had negative DVT ultrasounds.  She does report a little bit of worsening cough.  Denies any Covid contacts has not been vaccinated.  Patient is a smoker.    Past Medical History:  Diagnosis Date  . Acid reflux   . Allergies   . Bronchitis   . Migraines     There are no problems to display for this patient.   Past Surgical History:  Procedure Laterality Date  . ABLATION    . BLADDER SURGERY    . CESAREAN SECTION    . CHOLECYSTECTOMY    . TUBAL LIGATION      Prior to Admission medications   Medication Sig Start Date End Date Taking? Authorizing Provider  albuterol (VENTOLIN HFA) 108 (90 Base) MCG/ACT inhaler Inhale 2 puffs into the lungs every 6 (six) hours as needed for wheezing or shortness of breath. 12/04/19   Fisher, Roselyn Bering, PA-C  azithromycin (ZITHROMAX) 250 MG tablet Take 2 pills day one and then 1 pill a day for the next 4 days 12/14/19   Don Perking,  [place], MD  traMADol (ULTRAM) 50 MG tablet Take 1 tablet (50 mg total) by mouth every 6 (six) hours as needed. 12/04/19   Faythe Ghee, PA-C    Allergies Aleve  [naproxen], Codeine, Fexofenadine, Naproxen sodium, Tetracyclines & related, Dicyclomine, Sulfamethoxazole, Doxycycline, Fexofenadine-pseudoephed er, Sulfa antibiotics, and Sulfur  History reviewed. No pertinent family history.  Social History Social History   Tobacco Use  . Smoking status: Current Every Day Smoker    Types: Cigarettes  . Smokeless tobacco: Never Used  Substance Use Topics  . Alcohol use: Yes    Comment: social  . Drug use: Yes    Types: Marijuana      Review of Systems Constitutional: No fever/chills Eyes: No visual changes. ENT: No sore throat. Cardiovascular: Positive chest pain Respiratory: Denies shortness of breath.  Positive pain with deep breathing, cough Gastrointestinal: No abdominal pain.  No nausea, no vomiting.  No diarrhea.  No constipation. Genitourinary: Negative for dysuria. Musculoskeletal: Negative for back pain. Skin: Negative for rash. Neurological: Negative for headaches, focal weakness or numbness. All other ROS negative ____________________________________________   PHYSICAL EXAM:  VITAL SIGNS: ED Triage Vitals  Enc Vitals Group     BP 03/21/20 1845 114/72     Pulse Rate 03/21/20 1845 75     Resp 03/21/20 1845 18     Temp 03/21/20 1847 97.8 F (  36.6 C)     Temp Source 03/21/20 1847 Oral     SpO2 03/21/20 1845 97 %     Weight 03/21/20 1847 186 lb (84.4 kg)     Height 03/21/20 1847 5' 4.5" (1.638 m)     Head Circumference --      Peak Flow --      Pain Score 03/21/20 1847 9     Pain Loc --      Pain Edu? --      Excl. in [place]? --     Constitutional: Alert and oriented. Well appearing and in no acute distress. Eyes: Conjunctivae are normal. EOMI. Head: Atraumatic. Nose: No congestion/rhinnorhea. Mouth/Throat: Mucous membranes are moist.   Neck: No  stridor. Trachea Midline. FROM Cardiovascular: Normal rate, regular rhythm. Grossly normal heart sounds.  Good peripheral circulation. Respiratory: Normal respiratory effort.  No retractions. Lungs CTAB. Gastrointestinal: Soft and nontender. No distention. No abdominal bruits.  Musculoskeletal: Trace edema to bilateral lower extremities may be slightly worse than the right no joint effusions. Neurologic:  Normal speech and language. No gross focal neurologic deficits are appreciated.  Skin:  Skin is warm, dry and intact. No rash noted. Psychiatric: Mood and affect are normal. Speech and behavior are normal. GU: Deferred   ____________________________________________   LABS (all labs ordered are listed, but only abnormal results are displayed)  Labs Reviewed  BASIC METABOLIC PANEL - Abnormal; Notable for the following components:      Result Value   Glucose, Bld 160 (*)    Calcium 8.4 (*)    All other components within normal limits  HEPATIC FUNCTION PANEL - Abnormal; Notable for the following components:   Total Protein 5.1 (*)    Albumin 3.0 (*)    All other components within normal limits  CBC  BRAIN NATRIURETIC PEPTIDE  FIBRIN DERIVATIVES D-DIMER ([facility] ONLY)  POC URINE PREG, ED  TROPONIN I (HIGH SENSITIVITY)  TROPONIN I (HIGH SENSITIVITY)   ____________________________________________   ED ECG REPORT I, Vanessa [place], the attending physician, personally viewed and interpreted this ECG.  Normal sinus rate 75, no ST elevation, T wave inversion in V3, normal intervals ____________________________________________  RADIOLOGY Robert Bellow, personally viewed and evaluated these images (plain radiographs) as part of my medical decision making, as well as reviewing the written report by the radiologist.  ED MD interpretation: No pneumonia noted  Official radiology report(s): DG Chest 2 View  Result Date: 03/21/2020 CLINICAL DATA:  Left chest pain EXAM: CHEST - 2 VIEW  COMPARISON:  01/31/2020 FINDINGS: Low lung volumes with bibasilar atelectasis. Heart is normal size. No effusions or acute bony abnormality. IMPRESSION: Low lung volumes, bibasilar atelectasis. Electronically Signed   By: Rolm Baptise M.D.   On: 03/21/2020 19:27    ____________________________________________   PROCEDURES  Procedure(s) performed (including Critical Care):  Procedures   ____________________________________________   INITIAL IMPRESSION / ASSESSMENT AND PLAN / ED COURSE   Deiondra Denley was evaluated in Emergency Department on 03/21/2020 for the symptoms described in the history of present illness. She was evaluated in the context of the global COVID-19 pandemic, which necessitated consideration that the patient might be at risk for infection with the SARS-CoV-2 virus that causes COVID-19. Institutional protocols and algorithms that pertain to the evaluation of patients at risk for COVID-19 are in a state of rapid change based on information released by regulatory bodies including the CDC and federal and state organizations. These policies and algorithms were followed during the patient's  care in the ED.    Most Likely DDx:  -MSK (atypical chest pain) but will get cardiac markers to evaluate for ACS given risk factors/age -Low suspicion for PE but will get D-dimer given she is low risk   DDx that was also considered d/t potential to cause harm, but was found less likely based on history and physical (as detailed above): -PNA (no fevers, cough but CXR to evaluate) -PNX (reassured with equal b/l breath sounds, CXR to evaluate) -Symptomatic anemia (will get H&H) -Aortic Dissection as no tearing pain and no radiation to the mid back, pulses equal in her upper and lower legs. -Pericarditis no rub on exam, EKG changes or hx to suggest dx -Tamponade (no notable SOB, tachycardic, hypotensive) -Esophageal rupture (no h/o diffuse vomitting/no crepitus)  Labs are reassuring, no  signs of anemia.  Electrolytes are reassuring Initial cardiac marker is negative. Chest x-ray no pneumonia  Reevaluated patient her pain is gotten better.  Patient is sitting in bed comfortably tolerating eating.  Repeat cardiac markers negative x2.  D-dimer is also negative.  No signs of liver failure.  Discussed with patient that she could have a mild case of bronchitis given the pain with breathing a little bit of cough with her smoking history.  We can trial some prednisone to see if that helps.  This time of low suspicion for other acute pathology and she can follow with cardiology due to her risk factors.  I discussed the provisional nature of ED diagnosis, the treatment so far, the ongoing plan of care, follow up appointments and return precautions with the patient and any family or support people present. They expressed understanding and agreed with the plan, discharged home.         ____________________________________________   FINAL CLINICAL IMPRESSION(S) / ED DIAGNOSES   Final diagnoses:  Chest pain, unspecified type  Bronchitis     MEDICATIONS GIVEN DURING THIS VISIT:  Medications  lidocaine (LIDODERM) 5 % 1 patch (1 patch Transdermal Patch Applied 03/21/20 2145)  acetaminophen (TYLENOL) tablet 1,000 mg (1,000 mg Oral Given 03/21/20 2145)     ED Discharge Orders         Ordered    predniSONE (DELTASONE) 20 MG tablet  Daily     Discontinue  Reprint     03/21/20 2336           Note:  This document was prepared using Dragon voice recognition software and may include unintentional dictation errors.   Concha Se, MD 03/21/20 2350

## 2020-03-21 NOTE — ED Notes (Signed)
Pt up to desk inquiring about wait time. Pt reassured she will be called when we have an open bed. Pt states understanding and returned to waiting area.

## 2020-03-21 NOTE — ED Triage Notes (Signed)
Pt c/o left sided stabbing chest pain since last night. A&O x4 in triage

## 2020-03-22 LAB — SARS CORONAVIRUS 2 BY RT PCR (HOSPITAL ORDER, PERFORMED IN ~~LOC~~ HOSPITAL LAB): SARS Coronavirus 2: NEGATIVE

## 2020-05-25 ENCOUNTER — Ambulatory Visit (INDEPENDENT_AMBULATORY_CARE_PROVIDER_SITE_OTHER): Payer: Medicaid Other | Admitting: Vascular Surgery

## 2020-05-25 ENCOUNTER — Encounter (INDEPENDENT_AMBULATORY_CARE_PROVIDER_SITE_OTHER): Payer: Self-pay | Admitting: Vascular Surgery

## 2020-05-25 ENCOUNTER — Other Ambulatory Visit: Payer: Self-pay

## 2020-05-25 VITALS — BP 143/78 | HR 82 | Resp 16 | Ht 64.5 in | Wt 189.8 lb

## 2020-05-25 DIAGNOSIS — N3281 Overactive bladder: Secondary | ICD-10-CM | POA: Insufficient documentation

## 2020-05-25 DIAGNOSIS — N393 Stress incontinence (female) (male): Secondary | ICD-10-CM | POA: Insufficient documentation

## 2020-05-25 DIAGNOSIS — M79605 Pain in left leg: Secondary | ICD-10-CM

## 2020-05-25 DIAGNOSIS — M79604 Pain in right leg: Secondary | ICD-10-CM | POA: Diagnosis not present

## 2020-05-25 DIAGNOSIS — J209 Acute bronchitis, unspecified: Secondary | ICD-10-CM | POA: Insufficient documentation

## 2020-05-25 DIAGNOSIS — Z87891 Personal history of nicotine dependence: Secondary | ICD-10-CM | POA: Diagnosis not present

## 2020-05-25 DIAGNOSIS — K5900 Constipation, unspecified: Secondary | ICD-10-CM | POA: Insufficient documentation

## 2020-05-25 DIAGNOSIS — K120 Recurrent oral aphthae: Secondary | ICD-10-CM | POA: Insufficient documentation

## 2020-05-25 DIAGNOSIS — G43909 Migraine, unspecified, not intractable, without status migrainosus: Secondary | ICD-10-CM | POA: Insufficient documentation

## 2020-05-25 DIAGNOSIS — K589 Irritable bowel syndrome without diarrhea: Secondary | ICD-10-CM | POA: Insufficient documentation

## 2020-05-25 DIAGNOSIS — L659 Nonscarring hair loss, unspecified: Secondary | ICD-10-CM | POA: Insufficient documentation

## 2020-05-25 DIAGNOSIS — Z79899 Other long term (current) drug therapy: Secondary | ICD-10-CM | POA: Insufficient documentation

## 2020-05-25 DIAGNOSIS — Z8659 Personal history of other mental and behavioral disorders: Secondary | ICD-10-CM | POA: Insufficient documentation

## 2020-05-25 DIAGNOSIS — L709 Acne, unspecified: Secondary | ICD-10-CM | POA: Insufficient documentation

## 2020-05-25 DIAGNOSIS — M7989 Other specified soft tissue disorders: Secondary | ICD-10-CM | POA: Insufficient documentation

## 2020-05-25 DIAGNOSIS — M79609 Pain in unspecified limb: Secondary | ICD-10-CM | POA: Insufficient documentation

## 2020-05-25 DIAGNOSIS — F329 Major depressive disorder, single episode, unspecified: Secondary | ICD-10-CM | POA: Insufficient documentation

## 2020-05-25 DIAGNOSIS — F419 Anxiety disorder, unspecified: Secondary | ICD-10-CM | POA: Insufficient documentation

## 2020-05-25 DIAGNOSIS — H6120 Impacted cerumen, unspecified ear: Secondary | ICD-10-CM | POA: Insufficient documentation

## 2020-05-25 DIAGNOSIS — L600 Ingrowing nail: Secondary | ICD-10-CM | POA: Insufficient documentation

## 2020-05-25 DIAGNOSIS — F32A Depression, unspecified: Secondary | ICD-10-CM | POA: Insufficient documentation

## 2020-05-25 NOTE — Assessment & Plan Note (Signed)

## 2020-05-25 NOTE — Progress Notes (Signed)
Patient ID: Jacqueline Patel, female   DOB: Jan 03, 1966, 54 y.o.   MRN: [id number]  Chief Complaint  Patient presents with  . New Patient (Initial Visit)    ref Okey Dupre le edema    HPI Jacqueline Patel is a 54 y.o. female.  I am asked to see the patient by A. Crawford, NP for evaluation of leg swelling and pain.  This has been going on for many months now without a clear cause or inciting event.  Both lower extremities are affected but the right leg may be a little bit worse.  She has now developed pain that is present on a daily basis.  This is associated with prominent swelling, redness of the skin, and tightness of the leg.  The pain is described as stinging and burning and is difficult to describe.  She says this now hurts her essentially every day pretty much all the time.  Elevation has not really helped her legs much.  She does not have any open wounds or infection.  She says the legs have gotten fiery hot and red at times, but current the discoloration is fairly mild.  No antecedent history of DVT or superficial thrombophlebitis to her knowledge.  She does have a history of tobacco use raising the specter of peripheral arterial disease.  There is no clear cause or inciting event that started the symptoms.     Past Medical History:  Diagnosis Date  . Acid reflux   . Allergies   . Bronchitis   . Migraines     Past Surgical History:  Procedure Laterality Date  . ABLATION    . BLADDER SURGERY    . CESAREAN SECTION    . CHOLECYSTECTOMY    . TUBAL LIGATION       Family History  Problem Relation Age of Onset  . COPD Mother   . Diabetes Mother   . Cirrhosis Mother        of the liver  . Diabetes Brother      Social History   Tobacco Use  . Smoking status: Current Every Day Smoker    Types: Cigarettes  . Smokeless tobacco: Never Used  Substance Use Topics  . Alcohol use: Yes    Comment: social  . Drug use: Yes    Types: Marijuana    Allergies  Allergen Reactions    . Aleve  [Naproxen] Rash and Hives  . Codeine Itching and Rash  . Fexofenadine Hives  . Naproxen Sodium Hives  . Tetracyclines & Related Hives and Rash  . Dicyclomine Itching  . Sulfamethoxazole Itching  . Doxycycline Other (See Comments) and Rash    According to [facility] on 10/12/2018. Pt not in department   . Fexofenadine-Pseudoephed Er Rash  . Sulfa Antibiotics Itching, Other (See Comments) and Rash    Bruising   . Sulfur Rash    Current Outpatient Medications  Medication Sig Dispense Refill  . albuterol (VENTOLIN HFA) 108 (90 Base) MCG/ACT inhaler Inhale 2 puffs into the lungs every 6 (six) hours as needed for wheezing or shortness of breath. 16 g 0  . butalbital-acetaminophen-caffeine (FIORICET) 50-325-40 MG tablet Take by mouth.    . cetirizine (ZYRTEC) 10 MG tablet Take 1 tablet by mouth daily.    . hydrOXYzine (VISTARIL) 25 MG capsule Take by mouth.    Marland Kitchen ibuprofen (ADVIL) 600 MG tablet Take by mouth every 6 (six) hours as needed.     . lubiprostone (AMITIZA) 24 MCG capsule Take by mouth.    Marland Kitchen  naproxen (NAPROSYN) 500 MG tablet Take by mouth.    Marland Kitchen omeprazole (PRILOSEC) 20 MG capsule Take 20 mg by mouth 2 (two) times daily.    . TOPAMAX 50 MG tablet Take 50 mg by mouth at bedtime.    Marland Kitchen azithromycin (ZITHROMAX) 250 MG tablet Take 2 pills day one and then 1 pill a day for the next 4 days (Patient not taking: Reported on 05/25/2020) 6 each 0  . traMADol (ULTRAM) 50 MG tablet Take 1 tablet (50 mg total) by mouth every 6 (six) hours as needed. (Patient not taking: Reported on 05/25/2020) 15 tablet 0   No current facility-administered medications for this visit.      REVIEW OF SYSTEMS (Negative unless checked)  Constitutional: [] Weight loss  [] Fever  [] Chills Cardiac: [] Chest pain   [] Chest pressure   [] Palpitations   [] Shortness of breath when laying flat   [] Shortness of breath at rest   [] Shortness of breath with exertion. Vascular:  [x] Pain in legs with walking   [x] Pain in  legs at rest   [x] Pain in legs when laying flat   [] Claudication   [] Pain in feet when walking  [] Pain in feet at rest  [] Pain in feet when laying flat   [] History of DVT   [] Phlebitis   [x] Swelling in legs   [] Varicose veins   [] Non-healing ulcers Pulmonary:   [] Uses home oxygen   [] Productive cough   [] Hemoptysis   [] Wheeze  [] COPD   [] Asthma Neurologic:  [] Dizziness  [] Blackouts   [] Seizures   [] History of stroke   [] History of TIA  [] Aphasia   [] Temporary blindness   [] Dysphagia   [] Weakness or numbness in arms   [] Weakness or numbness in legs Musculoskeletal:  [x] Arthritis   [] Joint swelling   [] Joint pain   [] Low back pain Hematologic:  [] Easy bruising  [] Easy bleeding   [] Hypercoagulable state   [] Anemic  [] Hepatitis Gastrointestinal:  [] Blood in stool   [] Vomiting blood  [] Gastroesophageal reflux/heartburn   [] Abdominal pain Genitourinary:  [] Chronic kidney disease   [] Difficult urination  [] Frequent urination  [] Burning with urination   [] Hematuria Skin:  [] Rashes   [] Ulcers   [] Wounds Psychological:  [x] History of anxiety   [x]  History of major depression.    Physical Exam BP (!) 143/78 (BP Location: Right Arm)   Pulse 82   Resp 16   Ht 5' 4.5" (1.638 m)   Wt 189 lb 12.8 oz (86.1 kg)   BMI 32.08 kg/m  Gen:  WD/WN, NAD.  Appears older than stated age Head: [place]/AT, No temporalis wasting.  Ear/Nose/Throat: Hearing grossly intact, nares w/o erythema or drainage, oropharynx w/o Erythema/Exudate Eyes: Conjunctiva clear, sclera non-icteric  Neck: trachea midline.  No JVD.  Pulmonary:  Good air movement, respirations not labored, no use of accessory muscles  Cardiac: RRR, no JVD Vascular:  Vessel Right Left  Radial Palpable Palpable                          DP  1+  2+  PT  1+  1+   Gastrointestinal:. No masses, surgical incisions, or scars. Musculoskeletal: M/S 5/5 throughout.  Extremities without ischemic changes.  No deformity or atrophy.  Mild to moderate stasis dermatitis  changes present on the right, mild changes on the left.  1-2+ right lower extremity edema, 1+ left lower extremity edema. Neurologic: Sensation grossly intact in extremities.  Symmetrical.  Speech is fluent. Motor exam as listed above. Psychiatric: Judgment intact, does  have an anxious affect Dermatologic: No rashes or ulcers noted.  No cellulitis or open wounds.    Radiology No results found.  Labs Recent Results (from the past 2160 hour(s))  Basic metabolic panel     Status: Abnormal   Collection Time: 03/21/20  6:49 PM  Result Value Ref Range   Sodium 140 135 - 145 mmol/L   Potassium 3.5 3.5 - 5.1 mmol/L   Chloride 109 98 - 111 mmol/L   CO2 24 22 - 32 mmol/L   Glucose, Bld 160 (H) 70 - 99 mg/dL    Comment: Glucose reference range applies only to samples taken after fasting for at least 8 hours.   BUN 10 6 - 20 mg/dL   Creatinine, Ser 9.51 0.44 - 1.00 mg/dL   Calcium 8.4 (L) 8.9 - 10.3 mg/dL   GFR calc non Af Amer >60 >60 mL/min   GFR calc Af Amer >60 >60 mL/min   Anion gap 7 5 - 15    Comment: Performed at [facility], [address] [place], [place] [address]  CBC     Status: None   Collection Time: 03/21/20  6:49 PM  Result Value Ref Range   WBC 6.3 4.0 - 10.5 K/uL   RBC 4.66 3.87 - 5.11 MIL/uL   Hemoglobin 14.0 12.0 - 15.0 g/dL   HCT 60.6 36 - 46 %   MCV 91.6 80.0 - 100.0 fL   MCH 30.0 26.0 - 34.0 pg   MCHC 32.8 30.0 - 36.0 g/dL   RDW 30.1 60.1 - 09.3 %   Platelets 236 150 - 400 K/uL   nRBC 0.0 0.0 - 0.2 %    Comment: Performed at [facility], [address], [place], [place] [address]  Troponin I (High Sensitivity)     Status: None   Collection Time: 03/21/20  6:49 PM  Result Value Ref Range   Troponin I (High Sensitivity) 4 <18 ng/L    Comment: (NOTE) Elevated high sensitivity troponin I (hsTnI) values and significant  changes across serial measurements may suggest ACS but many other  chronic and acute conditions are known to elevate  hsTnI results.  Refer to the "Links" section for chest pain algorithms and additional  guidance. Performed at [facility], [address] [place], [place] [address]   Brain natriuretic peptide     Status: None   Collection Time: 03/21/20  6:49 PM  Result Value Ref Range   B Natriuretic Peptide 26.1 0.0 - 100.0 pg/mL    Comment: Performed at [facility], [address] [place], [place] [address]  Troponin I (High Sensitivity)     Status: None   Collection Time: 03/21/20  9:44 PM  Result Value Ref Range   Troponin I (High Sensitivity) 3 <18 ng/L    Comment: (NOTE) Elevated high sensitivity troponin I (hsTnI) values and significant  changes across serial measurements may suggest ACS but many other  chronic and acute conditions are known to elevate hsTnI results.  Refer to the "Links" section for chest pain algorithms and additional  guidance. Performed at [facility], [address] [place], [place] [address]   Hepatic function panel     Status: Abnormal   Collection Time: 03/21/20  9:44 PM  Result Value Ref Range   Total Protein 5.1 (L) 6.5 - 8.1 g/dL   Albumin 3.0 (L) 3.5 - 5.0 g/dL   AST 23 15 - 41 U/L   ALT 30 0 -  44 U/L   Alkaline Phosphatase 41 38 - 126 U/L   Total Bilirubin 0.6 0.3 - 1.2 mg/dL   Bilirubin, Direct <1.6<0.1 0.0 - 0.2 mg/dL   Indirect Bilirubin NOT CALCULATED 0.3 - 0.9 mg/dL    Comment: Performed at [facility], [address] [place], [place] [address]  Fibrin derivatives D-Dimer [facility] only)     Status: None   Collection Time: 03/21/20  9:44 PM  Result Value Ref Range   Fibrin derivatives D-dimer ([facility]) 460.11 0.00 - 499.00 ng/mL (FEU)    Comment: (NOTE) <> Exclusion of Venous Thromboembolism (VTE) - OUTPATIENT ONLY   (Emergency Department or [place])    0-499 ng/ml (FEU): With a low to intermediate pretest probability                      for VTE this test result excludes the diagnosis                      of  VTE.   >499 ng/ml (FEU) : VTE not excluded; additional work up for VTE is                      required.  <> Testing on Inpatients and Evaluation of Disseminated Intravascular   Coagulation (DIC) Reference Range:   0-499 ng/ml (FEU) Performed at [facility], [address] [place], [place] [address]   SARS Coronavirus 2 by RT PCR (hospital order, performed in [facility] hospital lab) Nasopharyngeal Nasopharyngeal Swab     Status: None   Collection Time: 03/22/20 12:09 AM   Specimen: Nasopharyngeal Swab  Result Value Ref Range   SARS Coronavirus 2 NEGATIVE NEGATIVE    Comment: (NOTE) SARS-CoV-2 target nucleic acids are NOT DETECTED.  The SARS-CoV-2 RNA is generally detectable in upper and lower respiratory specimens during the acute phase of infection. The lowest concentration of SARS-CoV-2 viral copies this assay can detect is 250 copies / mL. A negative result does not preclude SARS-CoV-2 infection and should not be used as the sole basis for treatment or other patient management decisions.  A negative result may occur with improper specimen collection / handling, submission of specimen other than nasopharyngeal swab, presence of viral mutation(s) within the areas targeted by this assay, and inadequate number of viral copies (<250 copies / mL). A negative result must be combined with clinical observations, patient history, and epidemiological information.  Fact Sheet for Patients:   BoilerBrush.com.cyhttps://www.fda.gov/media/136312/download  Fact Sheet for Healthcare Providers: https://pope.com/https://www.fda.gov/media/136313/download  This test is not yet approved or  cleared by the [place] FDA and has been authorized for detection and/or diagnosis of SARS-CoV-2 by FDA under an Emergency Use Authorization (EUA).  This EUA will remain in effect (meaning this test can be used) for the duration of the COVID-19 declaration under Section 564(b)(1) of the Act, 21 U.S.C. section 360bbb-3(b)(1),  unless the authorization is terminated or revoked sooner.  Performed at [facility], [address] [place], [place] [address]     Assessment/Plan:  History of tobacco use Represents a significant atherosclerotic risk factor.  Although her pain is not entirely typical for PAD, with her smoking history I think it is important to assess her for arterial disease as well.  Pain in limb  Recommend:  The patient has atypical pain symptoms for pure atherosclerotic disease. 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  Noninvasive studies including ABI's and venous ultrasound of the legs will be obtained and the patient will follow up with me to review these studies.   The patient should continue walking and begin a more formal exercise program. The patient should continue his antiplatelet therapy and aggressive treatment of the lipid abnormalities.  The patient should begin wearing graduated compression socks 15-20 mmHg strength to control edema.   Swelling of limb I have had a long discussion with the patient regarding swelling and why it  causes symptoms.  Patient will begin wearing graduated compression stockings class 1 (20-30 mmHg) on a daily basis a prescription was given. The patient will  beginning wearing the stockings first thing in the morning and removing them in the evening. The patient is instructed specifically not to sleep in the stockings.   In addition, behavioral modification will be initiated.  This will include frequent elevation, use of over the counter pain medications and exercise such as walking.  I have reviewed systemic causes for chronic edema such as liver, kidney and cardiac etiologies.  The patient denies problems with these organ systems.    Consideration for a lymph pump will also be made based upon the effectiveness of  conservative therapy.  This would help to improve the edema control and prevent sequela such as ulcers and infections   Patient should undergo duplex ultrasound of the venous system to ensure that DVT or reflux is not present.  The patient will follow-up with me after the ultrasound.        Festus Barren 05/25/2020, 10:10 AM   This note was created with Dragon medical transcription system.  Any errors from dictation are unintentional.

## 2020-05-25 NOTE — Assessment & Plan Note (Signed)

## 2020-05-25 NOTE — Assessment & Plan Note (Signed)
Represents a significant atherosclerotic risk factor.  Although her pain is not entirely typical for PAD, with her smoking history I think it is important to assess her for arterial disease as well.

## 2020-05-25 NOTE — Patient Instructions (Signed)
Peripheral Vascular Disease  Peripheral vascular disease (PVD) is a disease of the blood vessels that are not part of your heart and brain. A simple term for PVD is poor circulation. In most cases, PVD narrows the blood vessels that carry blood from your heart to the rest of your body. This can reduce the supply of blood to your arms, legs, and internal organs, like your stomach or kidneys. However, PVD most often affects a person's lower legs and feet. Without treatment, PVD tends to get worse. PVD can also lead to acute ischemic limb. This is when an arm or leg suddenly cannot get enough blood. This is a medical emergency. Follow these instructions at home: Lifestyle  Do not use any products that contain nicotine or tobacco, such as cigarettes and e-cigarettes. If you need help quitting, ask your doctor.  Lose weight if you are overweight. Or, stay at a healthy weight as told by your doctor.  Eat a diet that is low in fat and cholesterol. If you need help, ask your doctor.  Exercise regularly. Ask your doctor for activities that are right for you. General instructions  Take over-the-counter and prescription medicines only as told by your doctor.  Take good care of your feet: ? Wear comfortable shoes that fit well. ? Check your feet often for any cuts or sores.  Keep all follow-up visits as told by your doctor This is important. Contact a doctor if:  You have cramps in your legs when you walk.  You have leg pain when you are at rest.  You have coldness in a leg or foot.  Your skin changes.  You are unable to get or have an erection (erectile dysfunction).  You have cuts or sores on your feet that do not heal. Get help right away if:  Your arm or leg turns cold, numb, and blue.  Your arms or legs become red, warm, swollen, painful, or numb.  You have chest pain.  You have trouble breathing.  You suddenly have weakness in your face, arm, or leg.  You become very  confused or you cannot speak.  You suddenly have a very bad headache.  You suddenly cannot see. Summary  Peripheral vascular disease (PVD) is a disease of the blood vessels.  A simple term for PVD is poor circulation. Without treatment, PVD tends to get worse.  Treatment may include exercise, low fat and low cholesterol diet, and quitting smoking. This information is not intended to replace advice given to you by your health care provider. Make sure you discuss any questions you have with your health care provider. Document Revised: 09/07/2017 Document Reviewed: 11/02/2016 Elsevier Patient Education  2020 [employer]  

## 2020-06-02 ENCOUNTER — Ambulatory Visit (INDEPENDENT_AMBULATORY_CARE_PROVIDER_SITE_OTHER): Payer: Medicaid Other

## 2020-06-02 ENCOUNTER — Other Ambulatory Visit: Payer: Self-pay

## 2020-06-02 ENCOUNTER — Ambulatory Visit (INDEPENDENT_AMBULATORY_CARE_PROVIDER_SITE_OTHER): Payer: Medicaid Other | Admitting: Nurse Practitioner

## 2020-06-02 ENCOUNTER — Encounter (INDEPENDENT_AMBULATORY_CARE_PROVIDER_SITE_OTHER): Payer: Self-pay | Admitting: Nurse Practitioner

## 2020-06-02 VITALS — BP 130/83 | HR 90 | Ht 64.0 in | Wt 188.0 lb

## 2020-06-02 DIAGNOSIS — M79605 Pain in left leg: Secondary | ICD-10-CM

## 2020-06-02 DIAGNOSIS — M79604 Pain in right leg: Secondary | ICD-10-CM

## 2020-06-02 DIAGNOSIS — I8312 Varicose veins of left lower extremity with inflammation: Secondary | ICD-10-CM

## 2020-06-02 DIAGNOSIS — M7989 Other specified soft tissue disorders: Secondary | ICD-10-CM | POA: Diagnosis not present

## 2020-06-02 DIAGNOSIS — Z87891 Personal history of nicotine dependence: Secondary | ICD-10-CM | POA: Diagnosis not present

## 2020-06-02 DIAGNOSIS — I8311 Varicose veins of right lower extremity with inflammation: Secondary | ICD-10-CM | POA: Diagnosis not present

## 2020-06-03 ENCOUNTER — Encounter (INDEPENDENT_AMBULATORY_CARE_PROVIDER_SITE_OTHER): Payer: Self-pay | Admitting: Nurse Practitioner
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
## 2020-06-15 ENCOUNTER — Other Ambulatory Visit: Payer: Self-pay | Admitting: Nurse Practitioner

## 2020-06-15 DIAGNOSIS — R29898 Other symptoms and signs involving the musculoskeletal system: Secondary | ICD-10-CM

## 2020-06-15 DIAGNOSIS — M5416 Radiculopathy, lumbar region: Secondary | ICD-10-CM

## 2020-06-15 DIAGNOSIS — M5442 Lumbago with sciatica, left side: Secondary | ICD-10-CM

## 2020-06-25 ENCOUNTER — Other Ambulatory Visit: Payer: Self-pay | Admitting: Family Medicine

## 2020-06-25 DIAGNOSIS — Z136 Encounter for screening for cardiovascular disorders: Secondary | ICD-10-CM

## 2020-07-16 ENCOUNTER — Other Ambulatory Visit: Payer: Self-pay

## 2020-07-16 ENCOUNTER — Ambulatory Visit
Admission: RE | Admit: 2020-07-16 | Discharge: 2020-07-16 | Disposition: A | Discharge status: Home / Self Care | Payer: Medicaid Other | Source: Ambulatory Visit | Attending: Nurse Practitioner | Admitting: Nurse Practitioner

## 2020-07-16 DIAGNOSIS — M5442 Lumbago with sciatica, left side: Secondary | ICD-10-CM | POA: Insufficient documentation

## 2020-07-16 DIAGNOSIS — M5416 Radiculopathy, lumbar region: Secondary | ICD-10-CM

## 2020-07-16 DIAGNOSIS — M5441 Lumbago with sciatica, right side: Secondary | ICD-10-CM | POA: Diagnosis present

## 2020-07-16 DIAGNOSIS — G8929 Other chronic pain: Secondary | ICD-10-CM | POA: Insufficient documentation

## 2020-07-16 DIAGNOSIS — R29898 Other symptoms and signs involving the musculoskeletal system: Secondary | ICD-10-CM

## 2020-10-05 ENCOUNTER — Emergency Department
Admission: EM | Admit: 2020-10-05 | Discharge: 2020-10-05 | Discharge status: Against Medical Advice | Payer: Medicaid Other

## 2020-10-05 NOTE — ED Notes (Signed)
No answer when called several times from lobby 

## 2020-10-09 ENCOUNTER — Emergency Department
Admission: EM | Admit: 2020-10-09 | Discharge: 2020-10-09 | Disposition: A | Discharge status: Home / Self Care | Payer: Medicaid Other | Attending: Emergency Medicine | Admitting: Emergency Medicine

## 2020-10-09 ENCOUNTER — Other Ambulatory Visit: Payer: Self-pay

## 2020-10-09 ENCOUNTER — Encounter: Payer: Self-pay | Admitting: Emergency Medicine

## 2020-10-09 DIAGNOSIS — R519 Headache, unspecified: Secondary | ICD-10-CM | POA: Insufficient documentation

## 2020-10-09 DIAGNOSIS — Z5321 Procedure and treatment not carried out due to patient leaving prior to being seen by health care provider: Secondary | ICD-10-CM | POA: Diagnosis not present

## 2020-10-09 DIAGNOSIS — R110 Nausea: Secondary | ICD-10-CM | POA: Diagnosis not present

## 2020-10-09 DIAGNOSIS — R6883 Chills (without fever): Secondary | ICD-10-CM | POA: Diagnosis not present

## 2020-10-09 LAB — CBC
HCT: 42.9 % (ref 36.0–46.0)
Hemoglobin: 14 g/dL (ref 12.0–15.0)
MCH: 29.2 pg (ref 26.0–34.0)
MCHC: 32.6 g/dL (ref 30.0–36.0)
MCV: 89.4 fL (ref 80.0–100.0)
Platelets: 262 10*3/uL (ref 150–400)
RBC: 4.8 MIL/uL (ref 3.87–5.11)
RDW: 14.2 % (ref 11.5–15.5)
WBC: 4.3 10*3/uL (ref 4.0–10.5)
nRBC: 0 % (ref 0.0–0.2)

## 2020-10-09 LAB — COMPREHENSIVE METABOLIC PANEL
ALT: 25 U/L (ref 0–44)
AST: 21 U/L (ref 15–41)
Albumin: 4 g/dL (ref 3.5–5.0)
Alkaline Phosphatase: 72 U/L (ref 38–126)
Anion gap: 10 (ref 5–15)
BUN: 10 mg/dL (ref 6–20)
CO2: 25 mmol/L (ref 22–32)
Calcium: 9.5 mg/dL (ref 8.9–10.3)
Chloride: 104 mmol/L (ref 98–111)
Creatinine, Ser: 0.69 mg/dL (ref 0.44–1.00)
GFR, Estimated: >60 mL/min (ref >60)
Glucose, Bld: 169 mg/dL — ABNORMAL HIGH (ref 70–99)
Potassium: 3.5 mmol/L (ref 3.5–5.1)
Sodium: 139 mmol/L (ref 135–145)
Total Bilirubin: 0.6 mg/dL (ref 0.3–1.2)
Total Protein: 7.2 g/dL (ref 6.5–8.1)

## 2020-10-09 LAB — LIPASE, BLOOD: Lipase: 37 U/L (ref 11–51)

## 2020-10-09 NOTE — ED Triage Notes (Signed)
Pt to ED via POV with c/o HA, nausea, chills that started after being bit by something on her face. Pt c/o generalized weakness. Pt seen on 12/28 and [facility] for eval of spider bite to the face.

## 2020-11-23 ENCOUNTER — Ambulatory Visit (INDEPENDENT_AMBULATORY_CARE_PROVIDER_SITE_OTHER): Payer: Medicaid Other | Admitting: Vascular Surgery

## 2021-02-26 IMAGING — CR DG CHEST 2V
2 series · 2 of 2 positions shown · non-contrast
Comparison: Radiograph 12/03/2018

CLINICAL DATA: Chest pain.

EXAM:
CHEST - 2 VIEW

[chest pa]
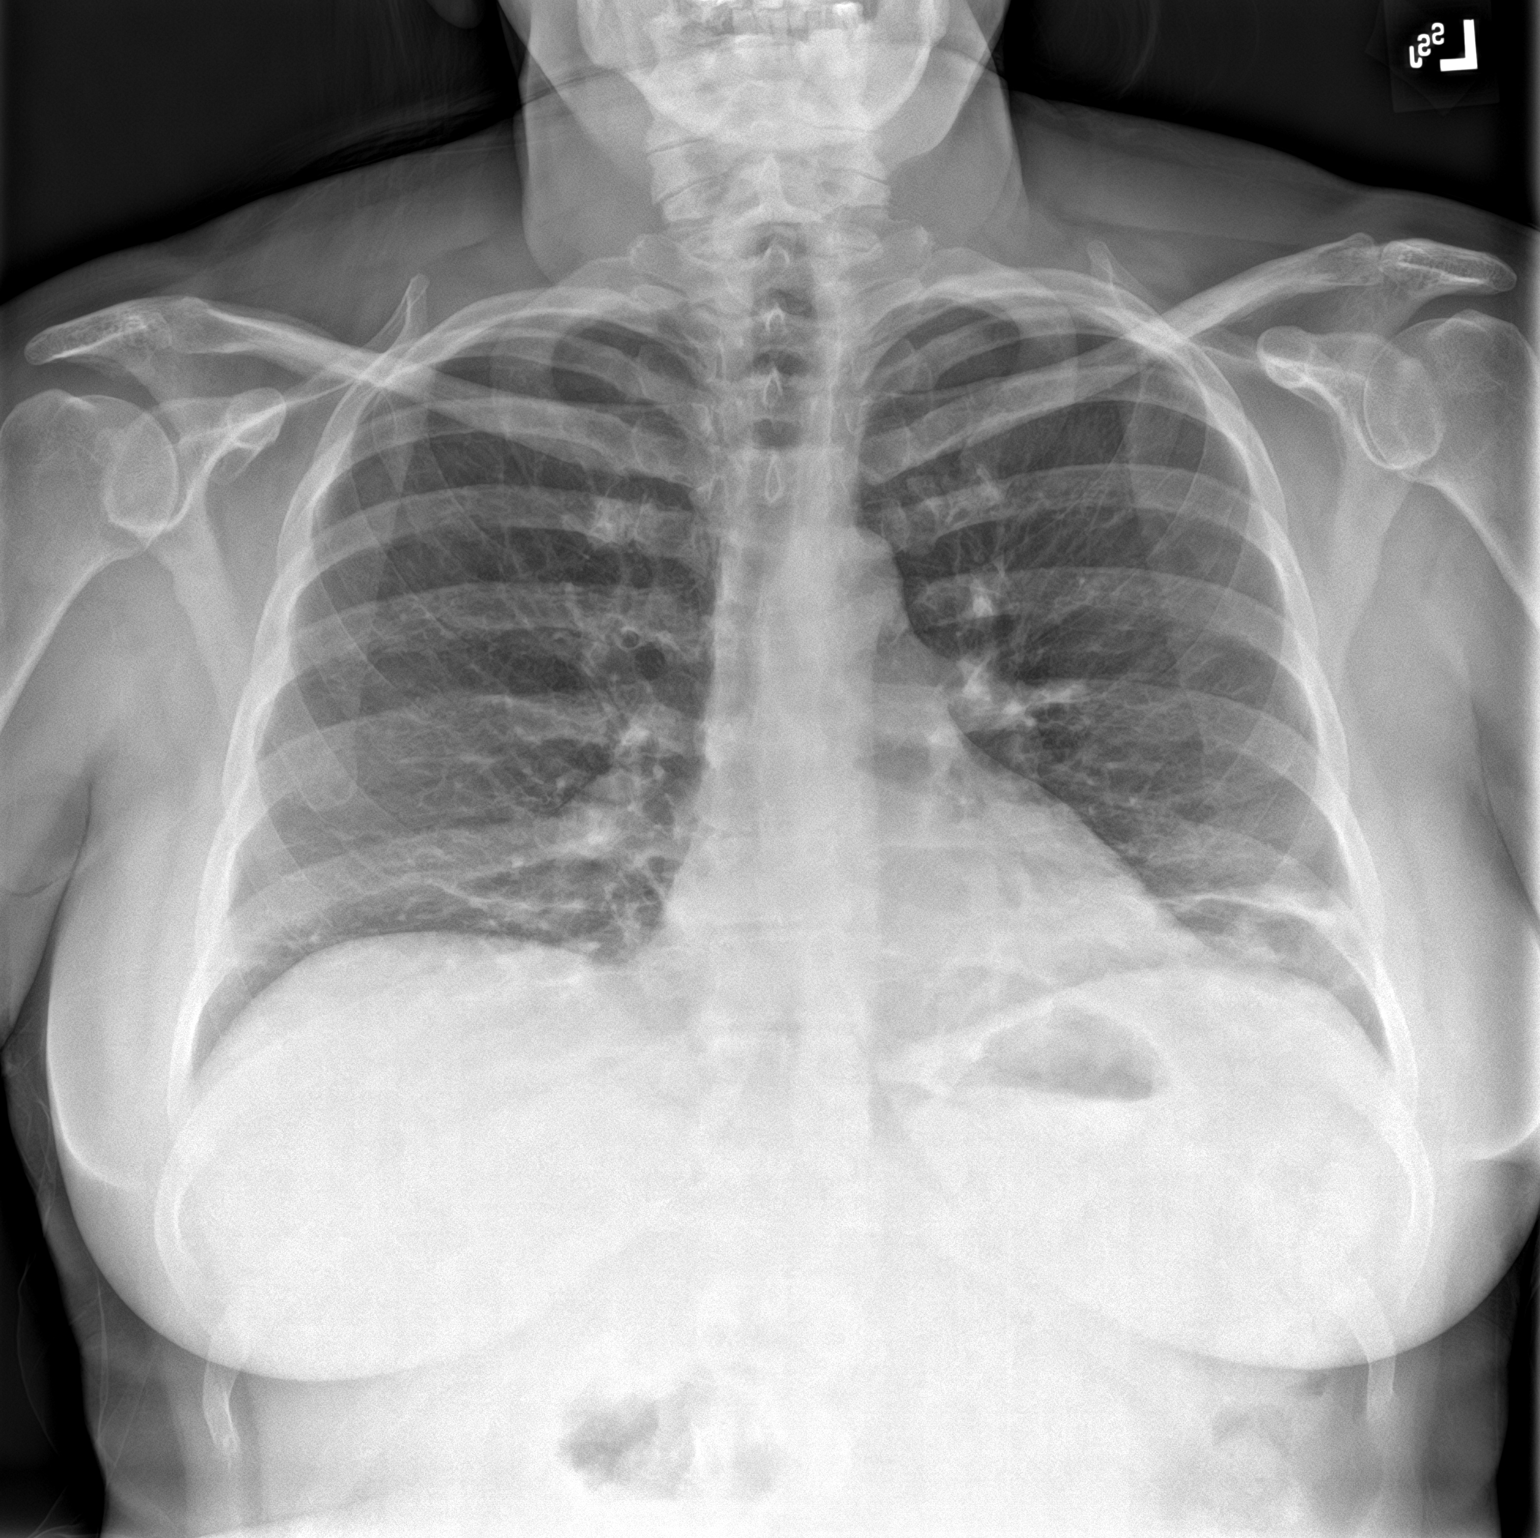

[chest lat]
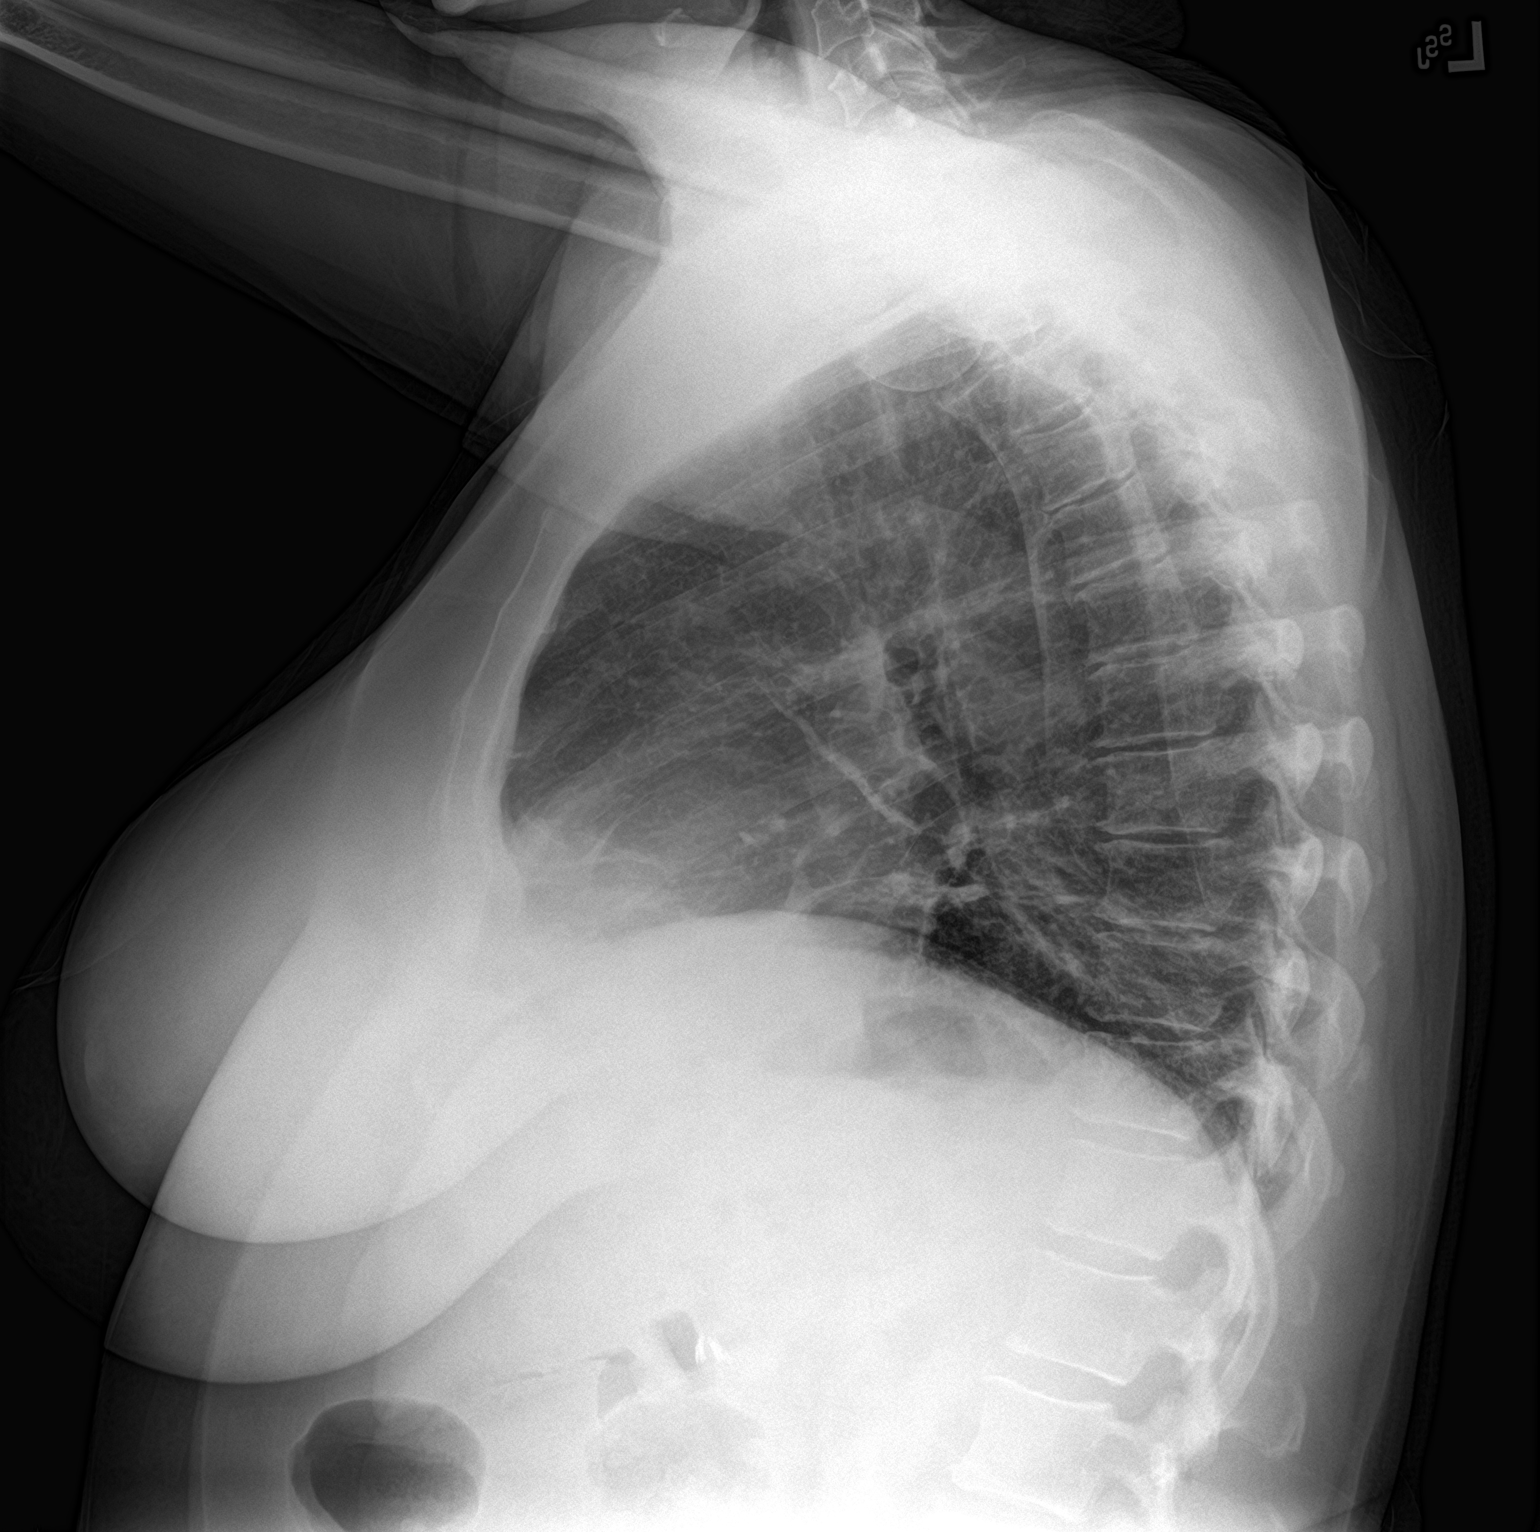

[2 of 2 positions shown; findings below may reference images not displayed]

FINDINGS: Low lung volumes with persistent but improved streaky bibasilar
opacities, likely atelectasis or scarring. No confluent airspace
disease. Normal heart size and mediastinal contours. No pulmonary
edema, pleural effusion, or pneumothorax. No acute osseous
abnormalities are seen.
IMPRESSION: Low lung volumes with bibasilar atelectasis or scarring.

## 2021-03-04 ENCOUNTER — Other Ambulatory Visit: Payer: Self-pay | Admitting: Family Medicine

## 2021-03-04 DIAGNOSIS — Z1231 Encounter for screening mammogram for malignant neoplasm of breast: Secondary | ICD-10-CM

## 2021-04-16 IMAGING — US US EXTREM LOW VENOUS
1 series · 13 of 24 positions shown · non-contrast
Comparison: None.

CLINICAL DATA: 53-year-old female with bilateral lower extremity
edema



[Series 1: us venous img lower bilat (dvt) · portal-venous · 13 of 60 slices shown]
[im 1/60]
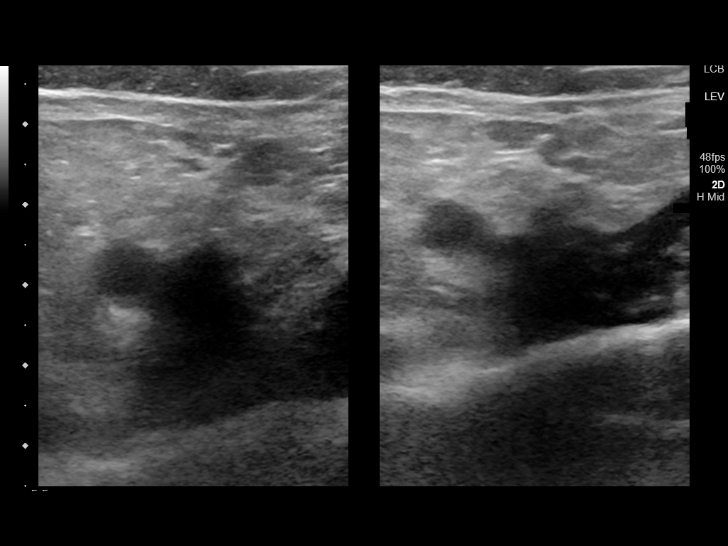
[im 6/60]
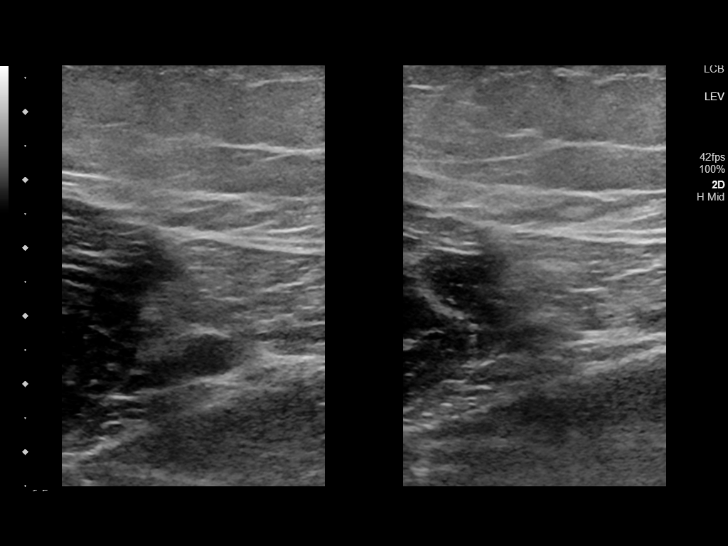
[im 11/60]
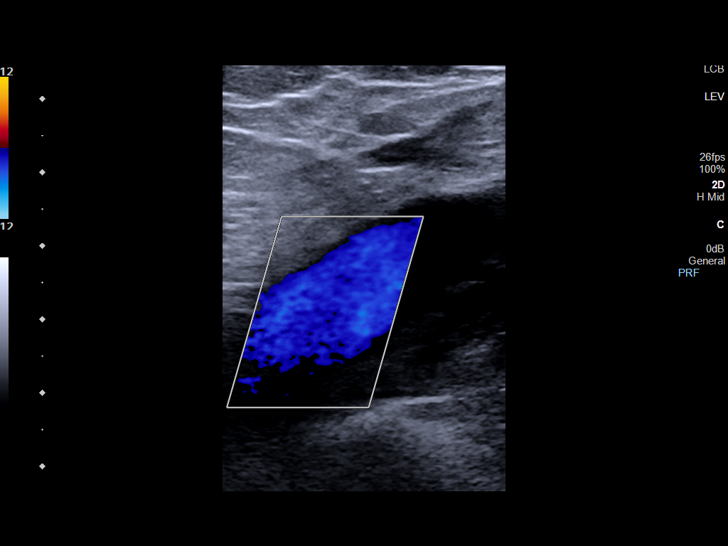
[im 16/60]
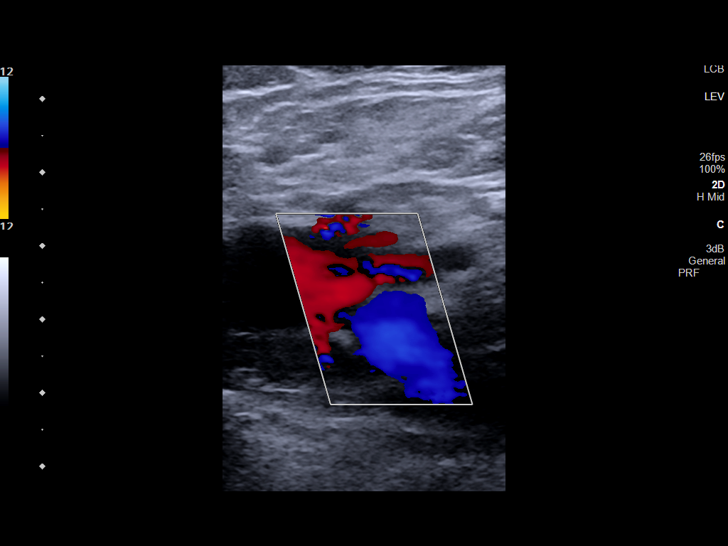
[im 21/60]
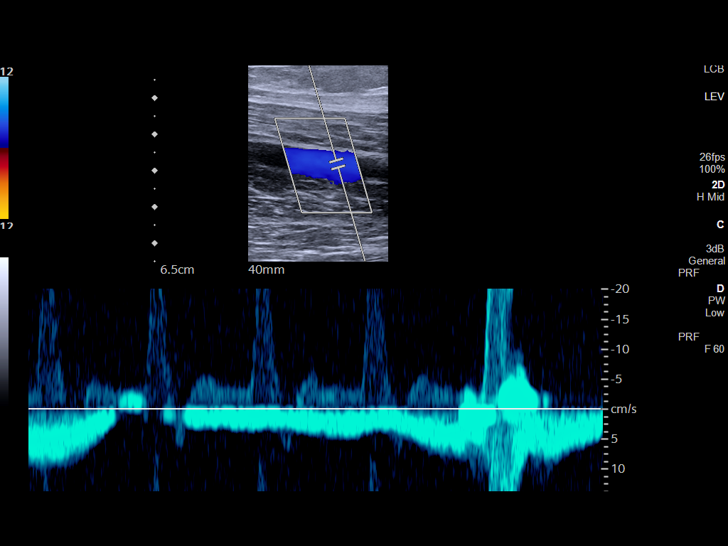
[im 26/60]
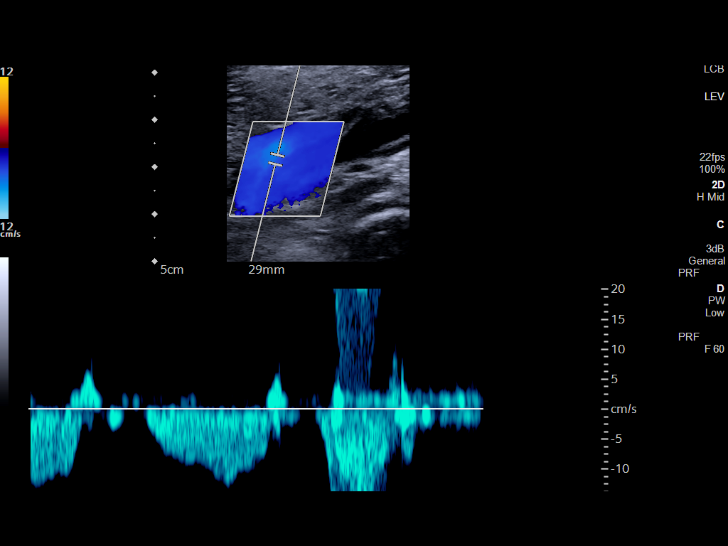
[im 31/60]
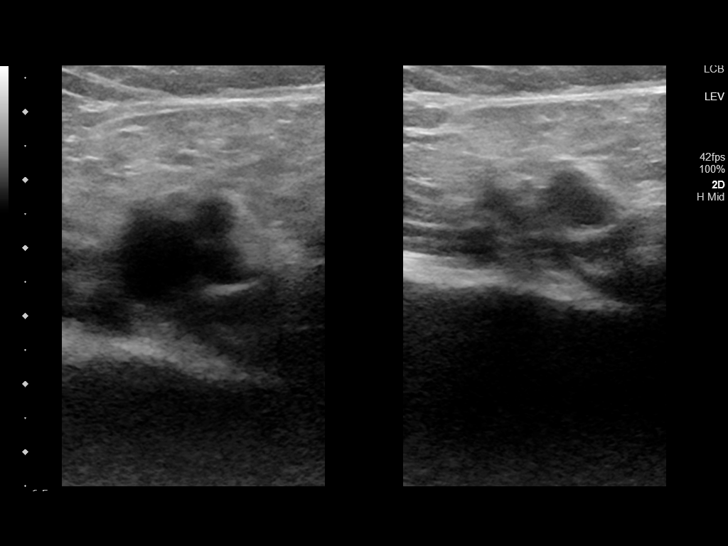
[im 34/60]
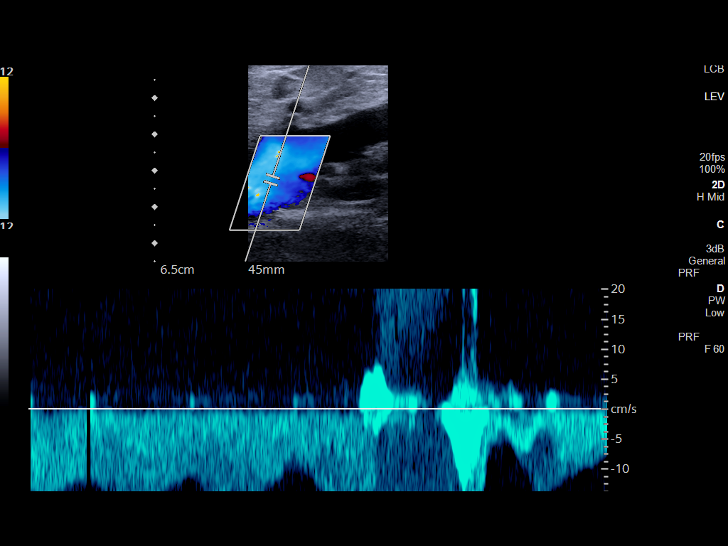
[im 39/60]
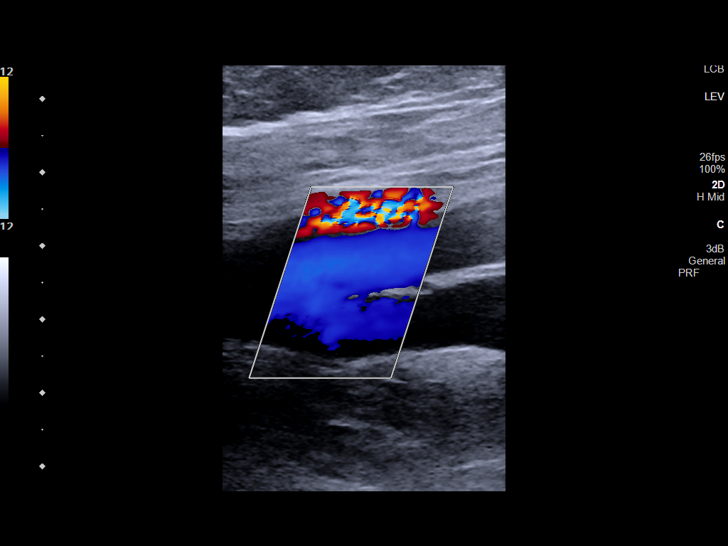
[im 44/60]
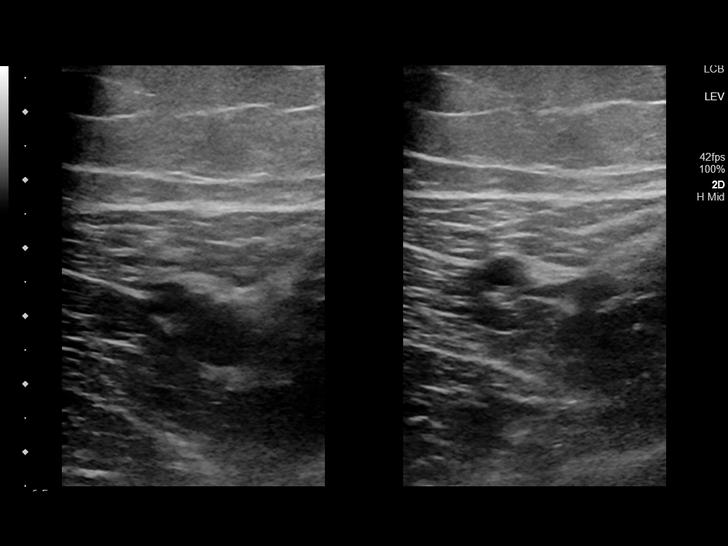
[im 49/60]
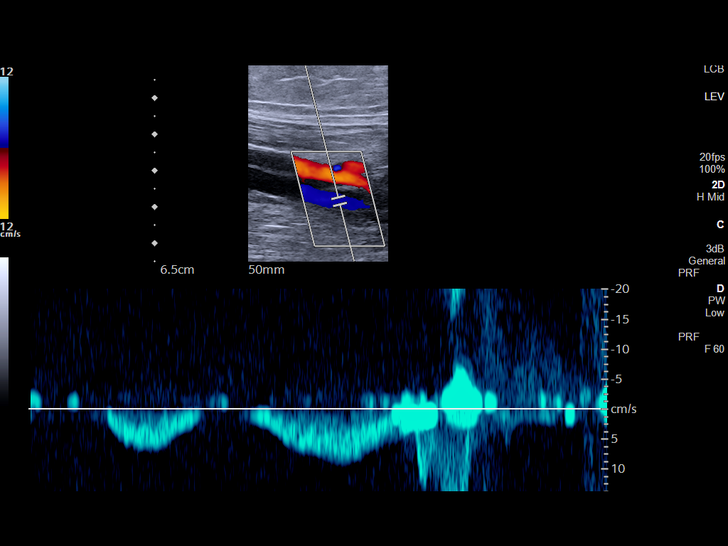
[im 54/60]
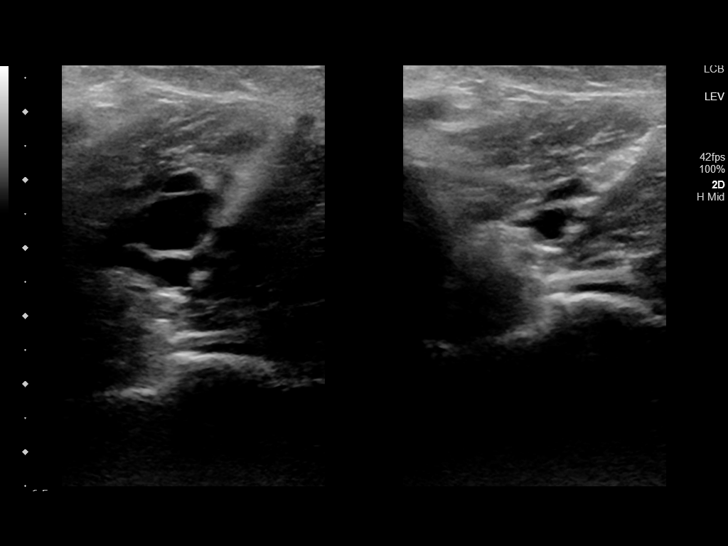
[im 60/60]
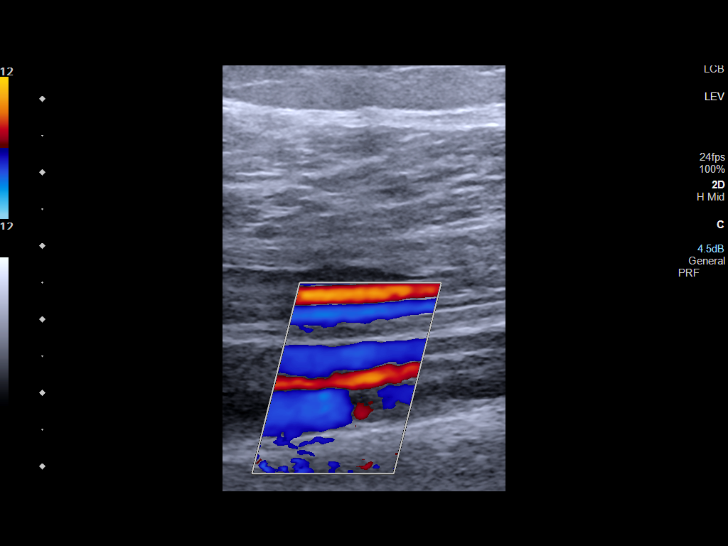

[13 of 24 positions shown; findings below may reference images not displayed]

FINDINGS: RIGHT LOWER EXTREMITY

Common Femoral Vein: No evidence of thrombus. Normal
compressibility, respiratory phasicity and response to augmentation.

Saphenofemoral Junction: No evidence of thrombus. Normal
compressibility and flow on color Doppler imaging.

Profunda Femoral Vein: No evidence of thrombus. Normal
compressibility and flow on color Doppler imaging.

Femoral Vein: No evidence of thrombus. Normal compressibility,
respiratory phasicity and response to augmentation.

Popliteal Vein: No evidence of thrombus. Normal compressibility,
respiratory phasicity and response to augmentation.

Calf Veins: No evidence of thrombus. Normal compressibility and flow
on color Doppler imaging.

Superficial Great Saphenous Vein: No evidence of thrombus. Normal
compressibility.

Venous Reflux:  None.

Other Findings:  None.

LEFT LOWER EXTREMITY

Common Femoral Vein: No evidence of thrombus. Normal
compressibility, respiratory phasicity and response to augmentation.

Saphenofemoral Junction: No evidence of thrombus. Normal
compressibility and flow on color Doppler imaging.

Profunda Femoral Vein: No evidence of thrombus. Normal
compressibility and flow on color Doppler imaging.

Femoral Vein: No evidence of thrombus. Normal compressibility,
respiratory phasicity and response to augmentation.

Popliteal Vein: No evidence of thrombus. Normal compressibility,
respiratory phasicity and response to augmentation.

Calf Veins: No evidence of thrombus. Normal compressibility and flow
on color Doppler imaging.

Superficial Great Saphenous Vein: No evidence of thrombus. Normal
compressibility.

Venous Reflux:  None.

Other Findings:  None.
IMPRESSION: No evidence of deep venous thrombosis in either lower extremity.

## 2021-04-16 IMAGING — CR DG CHEST 2V
2 series · 2 of 2 positions shown · non-contrast
Comparison: 01/24/2020

CLINICAL DATA: Chest pain

EXAM:
CHEST - 2 VIEW

[chest pa]
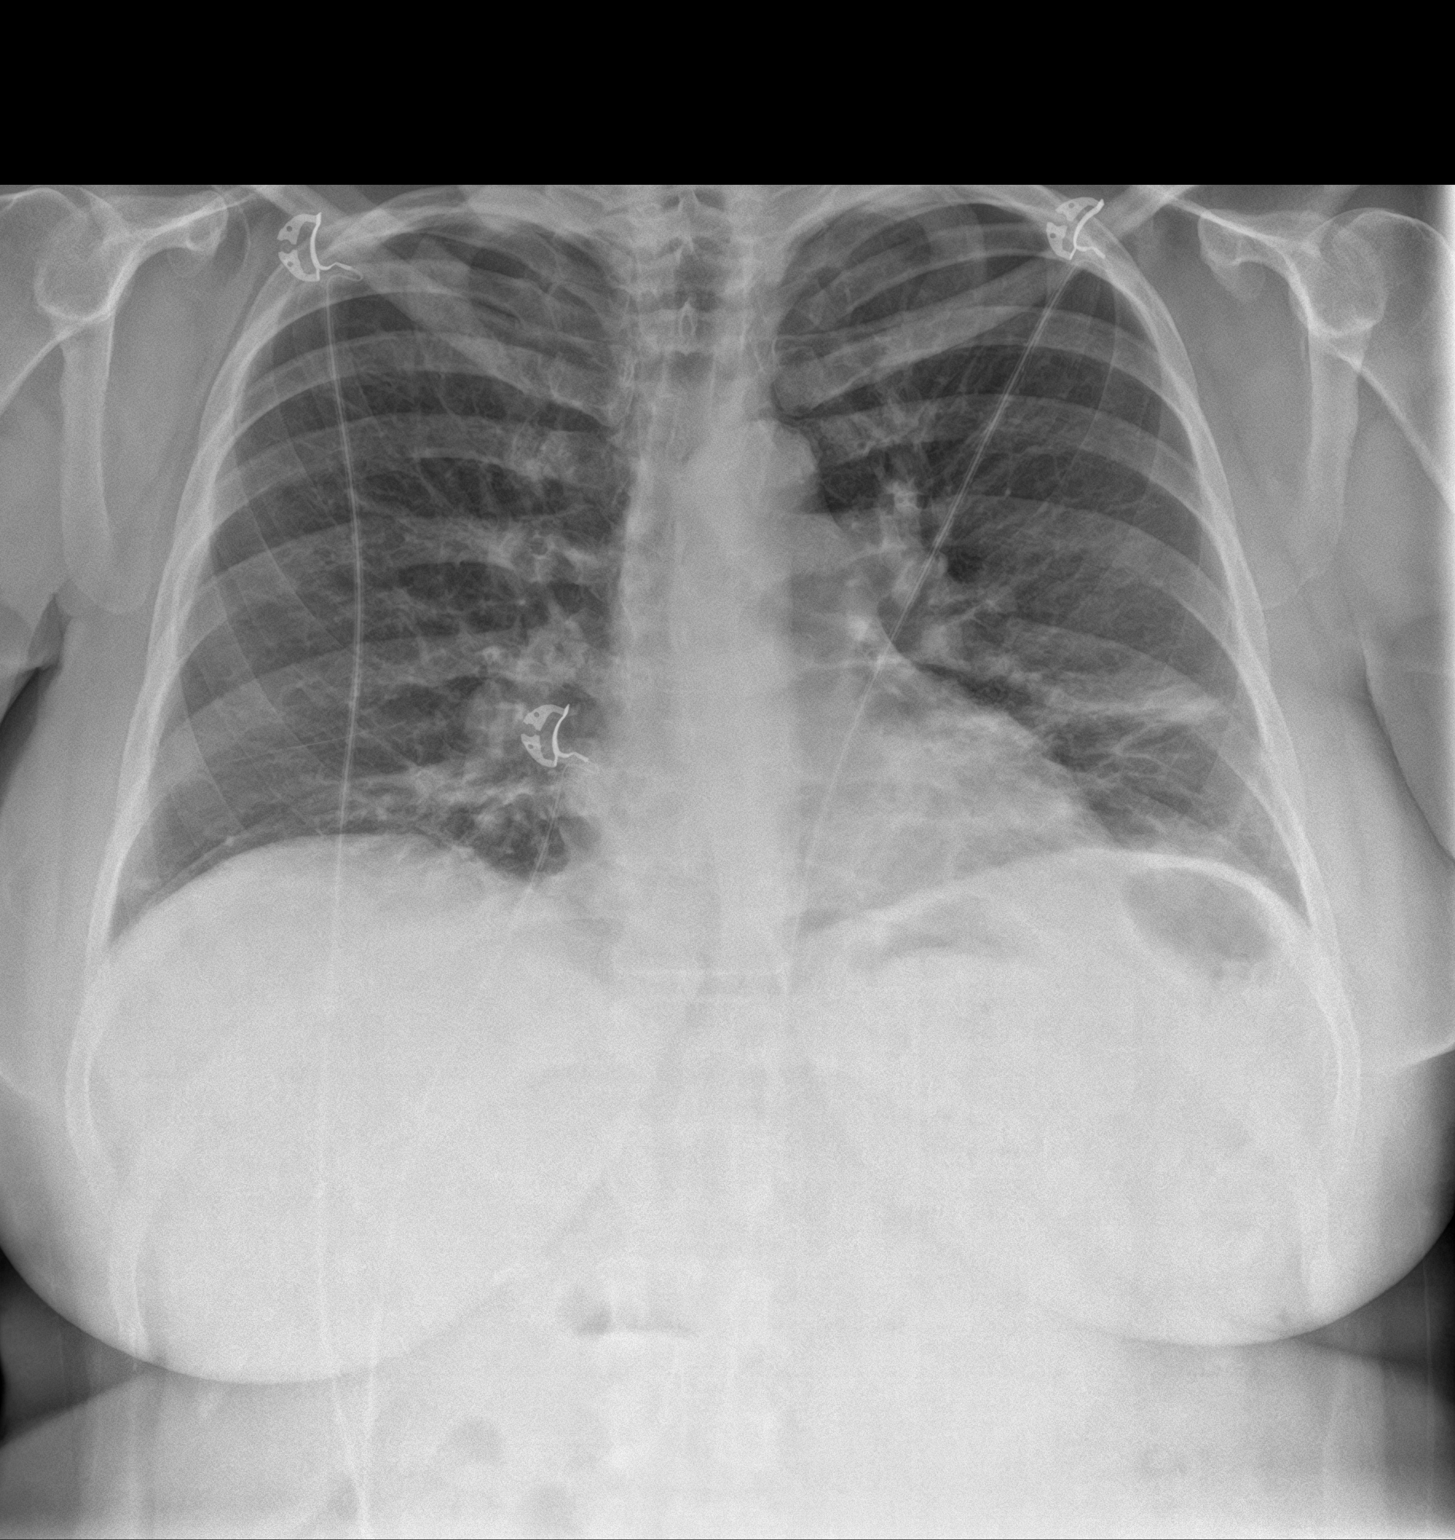

[chest lat]
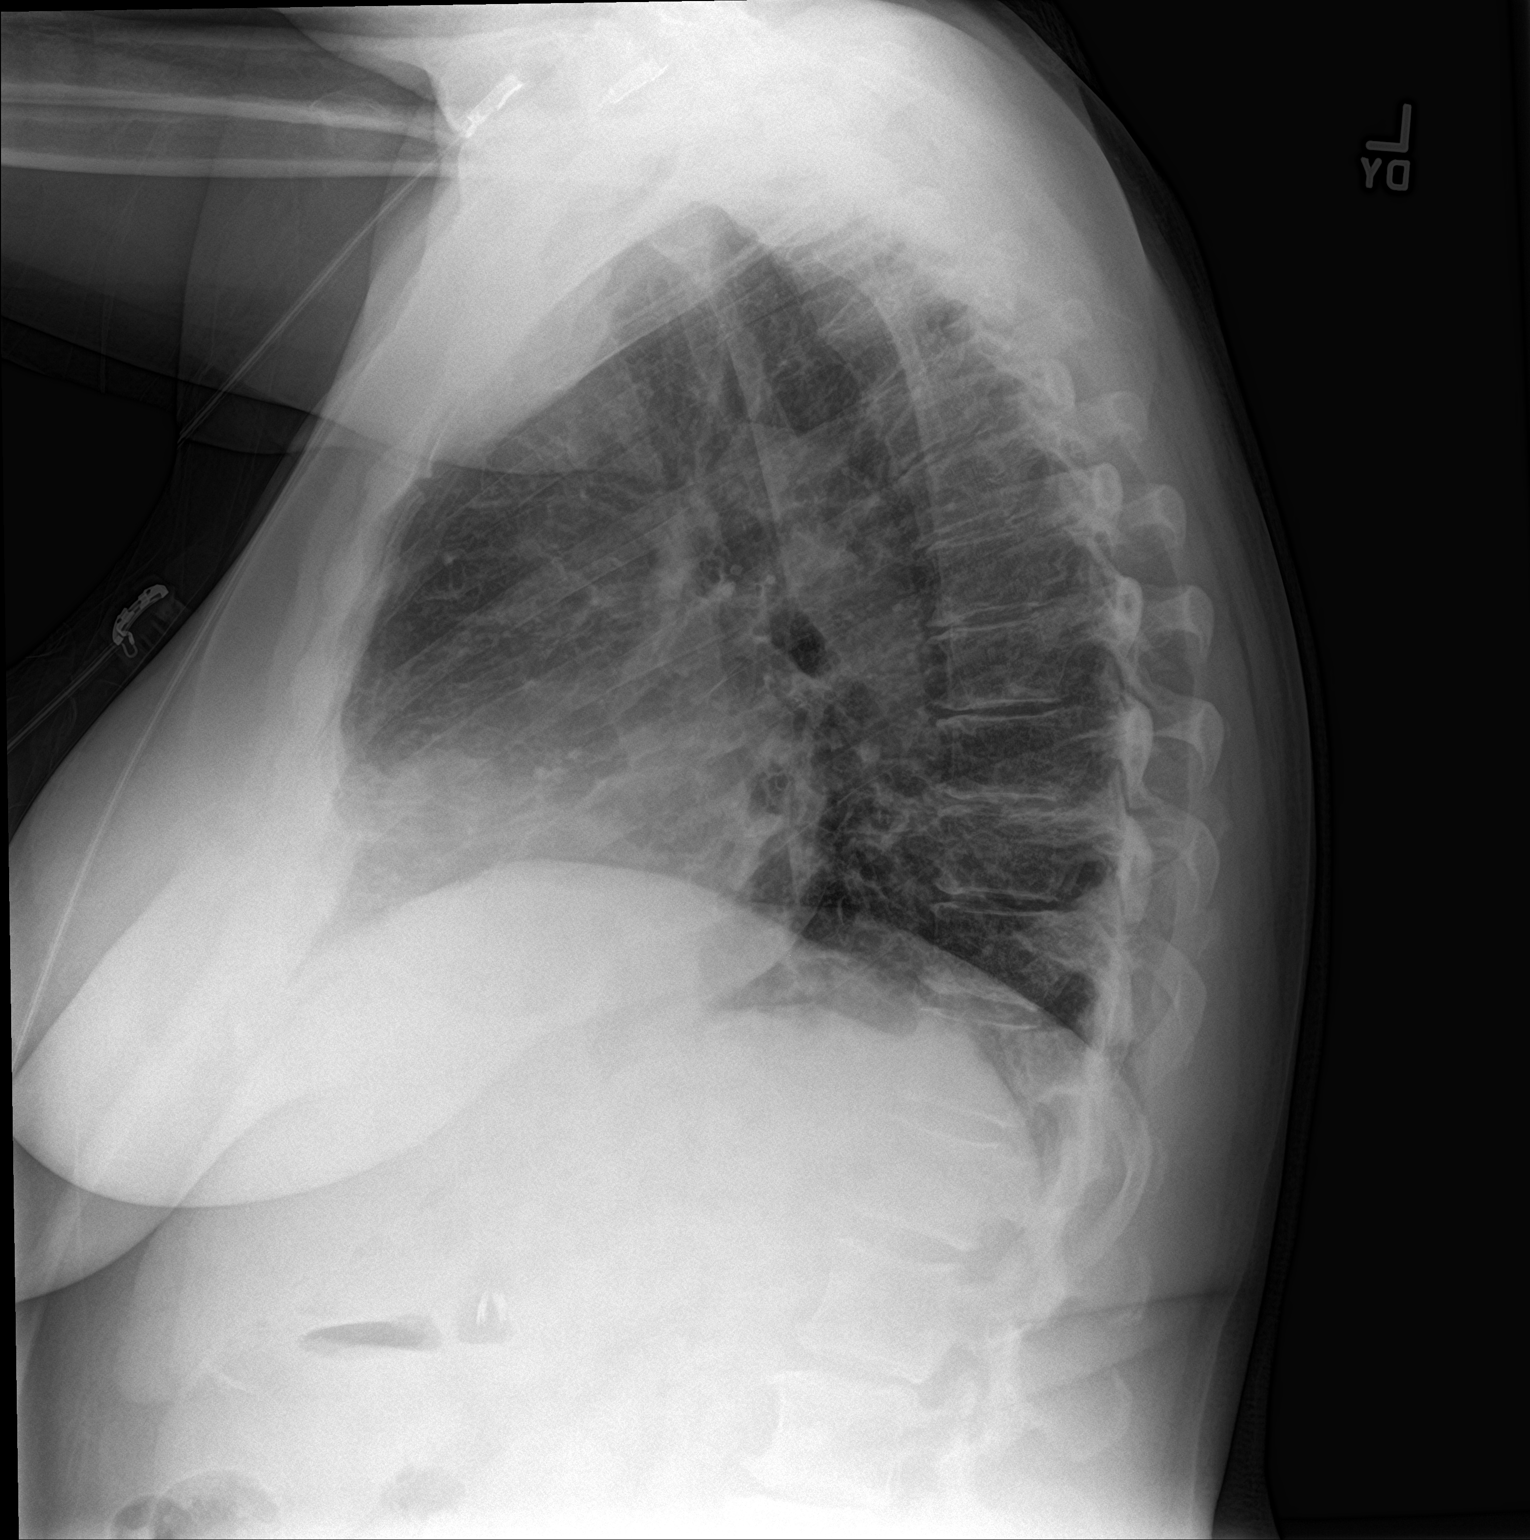

[2 of 2 positions shown; findings below may reference images not displayed]

FINDINGS: The heart size and mediastinal contours are within normal limits.
Somewhat bandlike and irregular airspace opacity of the left lower
lobe, which is increased compared to prior radiographs dated
01/25/2020 although similar in appearance to examination dated
12/13/2019. The visualized skeletal structures are unremarkable.
IMPRESSION: Somewhat bandlike and irregular airspace opacity of the left lower
lobe, which is increased compared to prior radiographs dated
01/25/2020 although similar in appearance to examination dated
12/13/2019. This is most consistent with atelectasis and scarring.

## 2021-04-26 ENCOUNTER — Emergency Department
Admission: EM | Admit: 2021-04-26 | Discharge: 2021-04-26 | Disposition: A | Discharge status: Home / Self Care | Payer: Medicaid Other | Attending: Emergency Medicine | Admitting: Emergency Medicine

## 2021-04-26 ENCOUNTER — Other Ambulatory Visit: Payer: Self-pay

## 2021-04-26 DIAGNOSIS — F17210 Nicotine dependence, cigarettes, uncomplicated: Secondary | ICD-10-CM | POA: Diagnosis not present

## 2021-04-26 DIAGNOSIS — K0889 Other specified disorders of teeth and supporting structures: Secondary | ICD-10-CM

## 2021-04-26 MED ORDER — AMOXICILLIN 875 MG PO TABS: 875.0000 mg | ORAL_TABLET | Freq: Two times a day (BID) | ORAL | 0 refills | Status: DC

## 2021-04-26 NOTE — ED Provider Notes (Signed)
[facility] Emergency Department Provider Note ____________________________________________   Event Date/Time   First MD Initiated Contact with Patient 04/26/21 1323     (approximate)  I have reviewed the triage vital signs and the nursing notes.   HISTORY  Chief Complaint Dental Pain    HPI Jacqueline Patel is a 55 y.o. female presents to the ED with complaint of right dental pain that started 3 days ago.  Currently she rates her pain as 10/10.         Past Medical History:  Diagnosis Date   Acid reflux    Allergies    Bronchitis    Migraines     Patient Active Problem List   Diagnosis Date Noted   Acne 05/25/2020   Acute bronchitis 05/25/2020   Anxiety 05/25/2020   Constipation 05/25/2020   Depression 05/25/2020   Encounter for long-term (current) use of high-risk medication 05/25/2020   Hair loss 05/25/2020   History of tobacco use 05/25/2020   Hx of bipolar disorder 05/25/2020   IBS (irritable bowel syndrome) 05/25/2020   Impacted cerumen 05/25/2020   Ingrown toenail 05/25/2020   Migraine headache 05/25/2020   Oral aphthae 05/25/2020   Overactive bladder 05/25/2020   Stress incontinence 05/25/2020   Pain in limb 05/25/2020   Swelling of limb 05/25/2020   Cigarette nicotine dependence without complication 11/15/2019   Excessive daytime sleepiness 11/15/2019   Flank pain, chronic 11/15/2019   GAD (generalized anxiety disorder) 11/15/2019   History of trichomoniasis 04/02/2019   Left renal stone 04/02/2019   Allergic rhinitis 08/12/2014   ASCUS with positive high risk HPV 08/12/2014   Bacterial vaginitis 08/12/2014   Bipolar I disorder, single manic episode (HCC) 08/12/2014   Headache 08/12/2014   Increased urinary frequency 08/12/2014   Mixed incontinence 08/12/2014   Urinary tract infection, site not specified 08/12/2014   Lower abdominal pain 08/12/2014   Post traumatic stress disorder 08/12/2014   Nocturia 08/12/2014     Past Surgical History:  Procedure Laterality Date   ABLATION     BLADDER SURGERY     CESAREAN SECTION     CHOLECYSTECTOMY     TUBAL LIGATION      Prior to Admission medications   Medication Sig Start Date End Date Taking? Authorizing Provider  amoxicillin (AMOXIL) 875 MG tablet Take 1 tablet (875 mg total) by mouth 2 (two) times daily. 04/26/21  Yes Tommi Rumps, PA-C  albuterol (VENTOLIN HFA) 108 (90 Base) MCG/ACT inhaler Inhale 2 puffs into the lungs every 6 (six) hours as needed for wheezing or shortness of breath. 12/04/19   Fisher, Roselyn Bering, PA-C  cetirizine (ZYRTEC) 10 MG tablet Take 1 tablet by mouth daily. 02/09/19   [provider]  hydrOXYzine (VISTARIL) 25 MG capsule Take by mouth. 11/17/19   [provider]  ibuprofen (ADVIL) 600 MG tablet Take by mouth every 6 (six) hours as needed.     [provider]  lubiprostone (AMITIZA) 24 MCG capsule Take by mouth.    [provider]  naproxen (NAPROSYN) 500 MG tablet Take by mouth. 07/29/17   [provider]  omeprazole (PRILOSEC) 20 MG capsule Take 20 mg by mouth 2 (two) times daily. 03/16/20   [provider]  TOPAMAX 50 MG tablet Take 50 mg by mouth at bedtime. 03/16/20   [provider]    Allergies Aleve  [naproxen], Codeine, Fexofenadine, Naproxen sodium, Tetracyclines & related, Dicyclomine, Sulfamethoxazole, Doxycycline, Elemental sulfur, Fexofenadine-pseudoephed er, and Sulfa antibiotics  Family History  Problem Relation Age of Onset   COPD Mother    Diabetes Mother    Cirrhosis Mother        of the liver   Diabetes Brother     Social History Social History   Tobacco Use   Smoking status: Every Day    Types: Cigarettes   Smokeless tobacco: Never  Substance Use Topics   Alcohol use: Yes    Comment: social   Drug use: Yes    Types: Marijuana    Review of Systems Constitutional: No fever/chills Eyes: No visual changes. ENT: No sore throat.   Positive for dental pain. Cardiovascular: Denies chest pain. Respiratory: Denies shortness of breath. Gastrointestinal:   No nausea, no vomiting.   Musculoskeletal: Negative for muscle skeletal pain. Skin: Negative for rash. Neurological: Negative for headaches, focal weakness or numbness. ____________________________________________   PHYSICAL EXAM:  VITAL SIGNS: ED Triage Vitals  Enc Vitals Group     BP 04/26/21 1212 (!) 146/77     Pulse Rate 04/26/21 1212 96     Resp 04/26/21 1212 18     Temp 04/26/21 1212 98.6 F (37 C)     Temp src --      SpO2 04/26/21 1212 97 %     Weight --      Height --      Head Circumference --      Peak Flow --      Pain Score 04/26/21 1211 10     Pain Loc --      Pain Edu? --      Excl. in GC? --     Constitutional: Alert and oriented. Well appearing and in no acute distress. Eyes: Conjunctivae are normal. PERRL. EOMI. Head: Atraumatic. Nose: No congestion/rhinnorhea. Mouth/Throat: Mucous membranes are moist.  Oropharynx non-erythematous.  Right upper premolar premolar gum area is edematous and tender.  No active drainage is noted. Neck: No stridor.   Cardiovascular: Normal rate, regular rhythm. Grossly normal heart sounds.  Good peripheral circulation. Respiratory: Normal respiratory effort.  No retractions. Lungs CTAB. Musculoskeletal: Moves upper and lower extremities they have difficulty.  Normal gait was noted. Neurologic:  Normal speech and language. No gross focal neurologic deficits are appreciated. No gait instability. Skin:  Skin is warm, dry and intact. No rash noted. Psychiatric: Mood and affect are normal. Speech and behavior are normal.  ____________________________________________   LABS (all labs ordered are listed, but only abnormal results are displayed)  Labs Reviewed - No data to display ____________________________________________  PROCEDURES  Procedure(s) performed (including Critical  Care):  Procedures   ____________________________________________   INITIAL IMPRESSION / ASSESSMENT AND PLAN / ED COURSE  As part of my medical decision making, I reviewed the following data within the electronic MEDICAL RECORD NUMBER Notes from prior ED visits and [place] Controlled Substance Database  55 year old female who presents to the ED with complaint of right upper dental pain for 3 days.  Patient states that she does not have a dentist.  There is tenderness on palpation of the gums in this area without obvious abscess.  A prescription for amoxicillin 875 twice daily for 10 days was sent to the pharmacy.  Patient is encouraged to take Tylenol or ibuprofen as needed for pain.  She was given list of dental clinics to follow-up with. ____________________________________________   FINAL CLINICAL IMPRESSION(S) / ED DIAGNOSES  Final diagnoses:  Pain, dental     ED Discharge Orders  Ordered    amoxicillin (AMOXIL) 875 MG tablet  2 times daily        04/26/21 1443             Note:  This document was prepared using Dragon voice recognition software and may include unintentional dictation errors.    Tommi Rumps, PA-C 04/26/21 1615    Merwyn Katos, MD 04/26/21 [phone]

## 2021-04-26 NOTE — Discharge Instructions (Addendum)
Follow-up with 1 the dental clinics listed on your discharge papers.  Also prescription for amoxicillin 875 mg was sent to the pharmacy to take twice a day for the next 10 days.  You may also take Tylenol or ibuprofen with this medication for pain.  Decrease or discontinue smoking as this increases your dental pain.  Avoid extremely hot or cold beverages as this may increase your dental pain as well.

## 2021-04-26 NOTE — ED Triage Notes (Signed)
Pt comes with c/o right sided dental pain. Pt states this started 3 days ago.

## 2021-06-08 ENCOUNTER — Other Ambulatory Visit: Payer: Self-pay

## 2021-06-10 ENCOUNTER — Encounter: Payer: Self-pay | Admitting: Gastroenterology

## 2021-06-10 ENCOUNTER — Ambulatory Visit: Payer: Medicaid Other | Admitting: Gastroenterology

## 2021-10-26 ENCOUNTER — Other Ambulatory Visit: Payer: Self-pay

## 2021-10-26 ENCOUNTER — Emergency Department
Admission: EM | Admit: 2021-10-26 | Discharge: 2021-10-26 | Disposition: A | Discharge status: Home / Self Care | Payer: Medicaid Other | Attending: Emergency Medicine | Admitting: Emergency Medicine

## 2021-10-26 DIAGNOSIS — R300 Dysuria: Secondary | ICD-10-CM | POA: Diagnosis not present

## 2021-10-26 LAB — URINALYSIS, COMPLETE (UACMP) WITH MICROSCOPIC
Bacteria, UA: NONE SEEN
Bilirubin Urine: NEGATIVE
Glucose, UA: NEGATIVE mg/dL
Hgb urine dipstick: NEGATIVE
Ketones, ur: NEGATIVE mg/dL
Leukocytes,Ua: NEGATIVE
Nitrite: NEGATIVE
Protein, ur: NEGATIVE mg/dL
Specific Gravity, Urine: 1.011 (ref 1.005–1.030)
pH: 6 (ref 5.0–8.0)

## 2021-10-26 LAB — WET PREP, GENITAL
Clue Cells Wet Prep HPF POC: NONE SEEN
Sperm: NONE SEEN
Trich, Wet Prep: NONE SEEN
WBC, Wet Prep HPF POC: <10 (ref <10)
Yeast Wet Prep HPF POC: NONE SEEN

## 2021-10-26 LAB — CHLAMYDIA/NGC RT PCR (ARMC ONLY)
Chlamydia Tr: NOT DETECTED
N gonorrhoeae: NOT DETECTED

## 2021-10-26 NOTE — ED Triage Notes (Signed)
Pt comes with c/o UTI. Pt was prescribed meds and has taken them all. Pt states it hasn't gotten better. Pt states 8/10 pain.

## 2021-10-26 NOTE — Discharge Instructions (Addendum)
Check MyChart later this evening to see if the cultures for gonorrhea and chlamydia are positive.  If so you will need to be treated.  This can be done at the [facility] or your primary care provider.  If it is positive call the [facility] and let them know that you were seen in the emergency department.

## 2021-10-26 NOTE — ED Notes (Signed)
Pt to ED for persistent UTI symptoms after treatment for UTI.  Pt was seen at [facility] and given ceftriaxone IM and 7 day course PO which she took as prescribed. She finished treatment 2 days ago.  Now pt still has urgency with urination, but does not have burning or pain. States is urinating more frequently than usual but is urinating normal amount. Hx overactive bladder, had "mesh" removed in 2016 and has had many UTIs in past.  States had fever and chills with UTI but not now; however does have L lower back pain now and some nausea, and feels run down and tired.

## 2021-10-26 NOTE — ED Provider Notes (Signed)
[facility] Provider Note    Event Date/Time   First MD Initiated Contact with Patient 10/26/21 1022     (approximate)   History   UTI   HPI  Jacqueline Patel is a 56 y.o. female   presents to the ED with complaint of possible urinary tract infection.  Patient states she was seen at an urgent care where she was told she had a UTI and was given an injection of an antibiotic and also pills.  Patient states that the discomfort has not improved.  She reports a sensation of urinary urgency.  Denies burning or frequency.  Patient has a history of bacterial vaginitis, trichomonas, incontinence, overactive bladder, IBS, bipolar disorder and PTSD.  Patient had a cholecystectomy and a tubal ligation with an ablation and no longer has menses.  Currently she rates her pain as an 8 out of 10.      Physical Exam   Triage Vital Signs: ED Triage Vitals  Enc Vitals Group     BP 10/26/21 0958 135/82     Pulse Rate 10/26/21 0958 77     Resp 10/26/21 0958 18     Temp 10/26/21 0958 97.9 F (36.6 C)     Temp Source 10/26/21 0958 Oral     SpO2 10/26/21 0958 98 %     Weight --      Height --      Head Circumference --      Peak Flow --      Pain Score 10/26/21 0957 8     Pain Loc --      Pain Edu? --      Excl. in GC? --     Most recent vital signs: Vitals:   10/26/21 0958 10/26/21 1350  BP: 135/82 138/84  Pulse: 77 75  Resp: 18 18  Temp: 97.9 F (36.6 C)   SpO2: 98% 99%     General: Awake, no distress.  CV:  Good peripheral perfusion.  Heart regular rate and rhythm without murmur. Resp:  Normal effort.  Lungs are clear bilaterally. Abd:  No distention.  Soft, nontender and bowel sounds normoactive x4 quadrants. Other:  Pelvic exam: External genitalia is unremarkable.  No exudate or discharge noted.  No adnexal masses or tenderness is noted.   ED Results / Procedures / Treatments   Labs (all labs ordered are listed, but only abnormal results are  displayed) Labs Reviewed  URINALYSIS, COMPLETE (UACMP) WITH MICROSCOPIC - Abnormal; Notable for the following components:      Result Value   Color, Urine YELLOW (*)    APPearance CLOUDY (*)    All other components within normal limits  WET PREP, GENITAL  CHLAMYDIA/NGC RT PCR ([facility] ONLY)               PROCEDURES:  Critical Care performed: No  Procedures   MEDICATIONS ORDERED IN ED: Medications - No data to display   IMPRESSION / MDM / ASSESSMENT AND PLAN / ED COURSE  I reviewed the triage vital signs and the nursing notes.   Differential diagnosis includes, but is not limited to, urinary tract infection, trichomonas, bacterial vaginosis, yeast vaginitis, and STI.  56 year old female presents to the ED with complaint of dysuria.  Patient states that she is already been treated for urinary tract infection at an urgent care where she received an injection and oral tablets.  She does not know the names of the antibiotics that she was given.  Pelvic exam  was unremarkable.  I reviewed the urinalysis and wet prep.  Urinalysis was cloudy but no bacteria, WBCs or RBCs and wet prep was negative for yeast, trichomonas, clue cells or WBCs.  A chlamydia and gonorrhea test was sent to the lab and patient is aware that this test will not result until several hours later.  We discussed how to look on MyChart to see the results of this test.  She is aware that if either of these test are positive she will need further treatment and contact the [facility] department for treatment or her PCP.   FINAL CLINICAL IMPRESSION(S) / ED DIAGNOSES   Final diagnoses:  Dysuria     Rx / DC Orders   ED Discharge Orders     None        Note:  This document was prepared using Dragon voice recognition software and may include unintentional dictation errors.   Tommi Rumps, PA-C 10/26/21 1357    Gilles Chiquito, MD 10/26/21 [phone]
